# Patient Record
Sex: Female | Born: 1935
Health system: Southern US, Community
[De-identification: ages and names within clinical notes are randomized; demographics above are authoritative.]

## PROBLEM LIST (undated history)

## (undated) DIAGNOSIS — I10 Essential (primary) hypertension: Secondary | ICD-10-CM

## (undated) DIAGNOSIS — M48061 Spinal stenosis, lumbar region without neurogenic claudication: Secondary | ICD-10-CM

## (undated) DIAGNOSIS — J329 Chronic sinusitis, unspecified: Secondary | ICD-10-CM

## (undated) DIAGNOSIS — T7840XA Allergy, unspecified, initial encounter: Secondary | ICD-10-CM

## (undated) DIAGNOSIS — M8580 Other specified disorders of bone density and structure, unspecified site: Secondary | ICD-10-CM

## (undated) DIAGNOSIS — Z720 Tobacco use: Secondary | ICD-10-CM

## (undated) DIAGNOSIS — M7070 Other bursitis of hip, unspecified hip: Secondary | ICD-10-CM

## (undated) DIAGNOSIS — K819 Cholecystitis, unspecified: Secondary | ICD-10-CM

## (undated) DIAGNOSIS — B029 Zoster without complications: Secondary | ICD-10-CM

## (undated) DIAGNOSIS — K635 Polyp of colon: Secondary | ICD-10-CM

## (undated) HISTORY — DX: Tobacco use: Z72.0

## (undated) HISTORY — PX: CHOLECYSTECTOMY: SHX55

## (undated) HISTORY — DX: Polyp of colon: K63.5

## (undated) HISTORY — PX: GALLBLADDER SURGERY: SHX652

## (undated) HISTORY — DX: Allergy, unspecified, initial encounter: T78.40XA

## (undated) HISTORY — DX: Chronic sinusitis, unspecified: J32.9

## (undated) HISTORY — DX: Spinal stenosis, lumbar region without neurogenic claudication: M48.061

## (undated) HISTORY — DX: Other bursitis of hip, unspecified hip: M70.70

## (undated) HISTORY — DX: Zoster without complications: B02.9

## (undated) HISTORY — PX: CATARACT EXTRACTION: SUR2

## (undated) HISTORY — DX: Other specified disorders of bone density and structure, unspecified site: M85.80

## (undated) HISTORY — DX: Cholecystitis, unspecified: K81.9

## (undated) HISTORY — DX: Essential (primary) hypertension: I10

## (undated) HISTORY — PX: CATARACT EXTRACTION, BILATERAL: SHX1313

---

## 1999-05-15 ENCOUNTER — Ambulatory Visit (HOSPITAL_BASED_OUTPATIENT_CLINIC_OR_DEPARTMENT_OTHER): Admission: RE | Admit: 1999-05-15 | Discharge: 1999-05-15 | Payer: Self-pay | Admitting: Plastic Surgery

## 2001-09-02 ENCOUNTER — Other Ambulatory Visit: Admission: RE | Admit: 2001-09-02 | Discharge: 2001-09-02 | Payer: Self-pay | Admitting: Family Medicine

## 2001-09-15 ENCOUNTER — Encounter: Admission: RE | Admit: 2001-09-15 | Discharge: 2001-09-28 | Payer: Self-pay | Admitting: Family Medicine

## 2002-11-30 ENCOUNTER — Ambulatory Visit (HOSPITAL_COMMUNITY): Admission: RE | Admit: 2002-11-30 | Discharge: 2002-11-30 | Payer: Self-pay | Admitting: Gastroenterology

## 2005-08-11 ENCOUNTER — Other Ambulatory Visit: Admission: RE | Admit: 2005-08-11 | Discharge: 2005-08-11 | Payer: Self-pay | Admitting: Family Medicine

## 2011-11-06 DIAGNOSIS — M76899 Other specified enthesopathies of unspecified lower limb, excluding foot: Secondary | ICD-10-CM | POA: Diagnosis not present

## 2011-11-10 DIAGNOSIS — M25559 Pain in unspecified hip: Secondary | ICD-10-CM | POA: Diagnosis not present

## 2011-11-10 DIAGNOSIS — M76899 Other specified enthesopathies of unspecified lower limb, excluding foot: Secondary | ICD-10-CM | POA: Diagnosis not present

## 2011-11-12 DIAGNOSIS — M25559 Pain in unspecified hip: Secondary | ICD-10-CM | POA: Diagnosis not present

## 2011-11-12 DIAGNOSIS — M76899 Other specified enthesopathies of unspecified lower limb, excluding foot: Secondary | ICD-10-CM | POA: Diagnosis not present

## 2011-11-17 DIAGNOSIS — M25559 Pain in unspecified hip: Secondary | ICD-10-CM | POA: Diagnosis not present

## 2011-11-17 DIAGNOSIS — M76899 Other specified enthesopathies of unspecified lower limb, excluding foot: Secondary | ICD-10-CM | POA: Diagnosis not present

## 2011-11-19 DIAGNOSIS — M76899 Other specified enthesopathies of unspecified lower limb, excluding foot: Secondary | ICD-10-CM | POA: Diagnosis not present

## 2011-11-19 DIAGNOSIS — M25559 Pain in unspecified hip: Secondary | ICD-10-CM | POA: Diagnosis not present

## 2011-11-24 DIAGNOSIS — M76899 Other specified enthesopathies of unspecified lower limb, excluding foot: Secondary | ICD-10-CM | POA: Diagnosis not present

## 2011-11-24 DIAGNOSIS — M25559 Pain in unspecified hip: Secondary | ICD-10-CM | POA: Diagnosis not present

## 2011-11-26 DIAGNOSIS — M76899 Other specified enthesopathies of unspecified lower limb, excluding foot: Secondary | ICD-10-CM | POA: Diagnosis not present

## 2011-11-26 DIAGNOSIS — M25559 Pain in unspecified hip: Secondary | ICD-10-CM | POA: Diagnosis not present

## 2011-11-27 DIAGNOSIS — M76899 Other specified enthesopathies of unspecified lower limb, excluding foot: Secondary | ICD-10-CM | POA: Diagnosis not present

## 2011-12-01 DIAGNOSIS — M76899 Other specified enthesopathies of unspecified lower limb, excluding foot: Secondary | ICD-10-CM | POA: Diagnosis not present

## 2011-12-01 DIAGNOSIS — M25559 Pain in unspecified hip: Secondary | ICD-10-CM | POA: Diagnosis not present

## 2011-12-03 DIAGNOSIS — M25559 Pain in unspecified hip: Secondary | ICD-10-CM | POA: Diagnosis not present

## 2011-12-08 DIAGNOSIS — M25559 Pain in unspecified hip: Secondary | ICD-10-CM | POA: Diagnosis not present

## 2011-12-08 DIAGNOSIS — M76899 Other specified enthesopathies of unspecified lower limb, excluding foot: Secondary | ICD-10-CM | POA: Diagnosis not present

## 2011-12-10 DIAGNOSIS — M76899 Other specified enthesopathies of unspecified lower limb, excluding foot: Secondary | ICD-10-CM | POA: Diagnosis not present

## 2011-12-10 DIAGNOSIS — M25559 Pain in unspecified hip: Secondary | ICD-10-CM | POA: Diagnosis not present

## 2011-12-22 DIAGNOSIS — M25559 Pain in unspecified hip: Secondary | ICD-10-CM | POA: Diagnosis not present

## 2011-12-22 DIAGNOSIS — M76899 Other specified enthesopathies of unspecified lower limb, excluding foot: Secondary | ICD-10-CM | POA: Diagnosis not present

## 2011-12-24 DIAGNOSIS — M25559 Pain in unspecified hip: Secondary | ICD-10-CM | POA: Diagnosis not present

## 2011-12-24 DIAGNOSIS — M76899 Other specified enthesopathies of unspecified lower limb, excluding foot: Secondary | ICD-10-CM | POA: Diagnosis not present

## 2011-12-30 DIAGNOSIS — M76899 Other specified enthesopathies of unspecified lower limb, excluding foot: Secondary | ICD-10-CM | POA: Diagnosis not present

## 2011-12-30 DIAGNOSIS — M25559 Pain in unspecified hip: Secondary | ICD-10-CM | POA: Diagnosis not present

## 2012-05-26 DIAGNOSIS — Z1231 Encounter for screening mammogram for malignant neoplasm of breast: Secondary | ICD-10-CM | POA: Diagnosis not present

## 2012-09-06 DIAGNOSIS — Z23 Encounter for immunization: Secondary | ICD-10-CM | POA: Diagnosis not present

## 2013-06-20 DIAGNOSIS — Z23 Encounter for immunization: Secondary | ICD-10-CM | POA: Diagnosis not present

## 2013-08-09 DIAGNOSIS — R7301 Impaired fasting glucose: Secondary | ICD-10-CM | POA: Diagnosis not present

## 2013-08-09 DIAGNOSIS — E559 Vitamin D deficiency, unspecified: Secondary | ICD-10-CM | POA: Diagnosis not present

## 2013-08-09 DIAGNOSIS — M899 Disorder of bone, unspecified: Secondary | ICD-10-CM | POA: Diagnosis not present

## 2013-08-09 DIAGNOSIS — E7800 Pure hypercholesterolemia, unspecified: Secondary | ICD-10-CM | POA: Diagnosis not present

## 2013-08-09 DIAGNOSIS — Z23 Encounter for immunization: Secondary | ICD-10-CM | POA: Diagnosis not present

## 2013-08-09 DIAGNOSIS — Z1331 Encounter for screening for depression: Secondary | ICD-10-CM | POA: Diagnosis not present

## 2013-08-09 DIAGNOSIS — R030 Elevated blood-pressure reading, without diagnosis of hypertension: Secondary | ICD-10-CM | POA: Diagnosis not present

## 2013-08-09 DIAGNOSIS — M25539 Pain in unspecified wrist: Secondary | ICD-10-CM | POA: Diagnosis not present

## 2013-08-21 DIAGNOSIS — Z1231 Encounter for screening mammogram for malignant neoplasm of breast: Secondary | ICD-10-CM | POA: Diagnosis not present

## 2013-08-21 DIAGNOSIS — M899 Disorder of bone, unspecified: Secondary | ICD-10-CM | POA: Diagnosis not present

## 2013-10-09 DIAGNOSIS — E7800 Pure hypercholesterolemia, unspecified: Secondary | ICD-10-CM | POA: Diagnosis not present

## 2013-10-09 DIAGNOSIS — J449 Chronic obstructive pulmonary disease, unspecified: Secondary | ICD-10-CM | POA: Diagnosis not present

## 2013-10-09 DIAGNOSIS — Z0000 Encounter for general adult medical examination without abnormal findings: Secondary | ICD-10-CM | POA: Diagnosis not present

## 2013-10-09 DIAGNOSIS — E782 Mixed hyperlipidemia: Secondary | ICD-10-CM | POA: Diagnosis not present

## 2013-10-09 DIAGNOSIS — E559 Vitamin D deficiency, unspecified: Secondary | ICD-10-CM | POA: Diagnosis not present

## 2013-10-09 DIAGNOSIS — R7301 Impaired fasting glucose: Secondary | ICD-10-CM | POA: Diagnosis not present

## 2013-10-09 DIAGNOSIS — D126 Benign neoplasm of colon, unspecified: Secondary | ICD-10-CM | POA: Diagnosis not present

## 2014-02-20 DIAGNOSIS — H02839 Dermatochalasis of unspecified eye, unspecified eyelid: Secondary | ICD-10-CM | POA: Diagnosis not present

## 2014-02-20 DIAGNOSIS — H18419 Arcus senilis, unspecified eye: Secondary | ICD-10-CM | POA: Diagnosis not present

## 2014-02-20 DIAGNOSIS — H43819 Vitreous degeneration, unspecified eye: Secondary | ICD-10-CM | POA: Diagnosis not present

## 2014-02-20 DIAGNOSIS — Z961 Presence of intraocular lens: Secondary | ICD-10-CM | POA: Diagnosis not present

## 2014-03-14 DIAGNOSIS — Z23 Encounter for immunization: Secondary | ICD-10-CM | POA: Diagnosis not present

## 2014-03-14 DIAGNOSIS — Z0000 Encounter for general adult medical examination without abnormal findings: Secondary | ICD-10-CM | POA: Diagnosis not present

## 2014-03-14 DIAGNOSIS — D126 Benign neoplasm of colon, unspecified: Secondary | ICD-10-CM | POA: Diagnosis not present

## 2014-03-14 DIAGNOSIS — E559 Vitamin D deficiency, unspecified: Secondary | ICD-10-CM | POA: Diagnosis not present

## 2014-03-14 DIAGNOSIS — E782 Mixed hyperlipidemia: Secondary | ICD-10-CM | POA: Diagnosis not present

## 2014-03-14 DIAGNOSIS — E7800 Pure hypercholesterolemia, unspecified: Secondary | ICD-10-CM | POA: Diagnosis not present

## 2014-03-14 DIAGNOSIS — R7301 Impaired fasting glucose: Secondary | ICD-10-CM | POA: Diagnosis not present

## 2014-03-14 DIAGNOSIS — J449 Chronic obstructive pulmonary disease, unspecified: Secondary | ICD-10-CM | POA: Diagnosis not present

## 2014-06-13 DIAGNOSIS — Z23 Encounter for immunization: Secondary | ICD-10-CM | POA: Diagnosis not present

## 2014-11-09 DIAGNOSIS — Z961 Presence of intraocular lens: Secondary | ICD-10-CM | POA: Diagnosis not present

## 2014-11-09 DIAGNOSIS — H18411 Arcus senilis, right eye: Secondary | ICD-10-CM | POA: Diagnosis not present

## 2014-11-09 DIAGNOSIS — H18412 Arcus senilis, left eye: Secondary | ICD-10-CM | POA: Diagnosis not present

## 2014-11-27 DIAGNOSIS — Z1231 Encounter for screening mammogram for malignant neoplasm of breast: Secondary | ICD-10-CM | POA: Diagnosis not present

## 2014-11-27 DIAGNOSIS — Z803 Family history of malignant neoplasm of breast: Secondary | ICD-10-CM | POA: Diagnosis not present

## 2015-01-23 DIAGNOSIS — R0989 Other specified symptoms and signs involving the circulatory and respiratory systems: Secondary | ICD-10-CM | POA: Diagnosis not present

## 2015-01-23 DIAGNOSIS — R05 Cough: Secondary | ICD-10-CM | POA: Diagnosis not present

## 2015-01-23 DIAGNOSIS — J189 Pneumonia, unspecified organism: Secondary | ICD-10-CM | POA: Diagnosis not present

## 2015-01-24 DIAGNOSIS — J449 Chronic obstructive pulmonary disease, unspecified: Secondary | ICD-10-CM | POA: Diagnosis not present

## 2015-01-24 DIAGNOSIS — J189 Pneumonia, unspecified organism: Secondary | ICD-10-CM | POA: Diagnosis not present

## 2015-02-11 DIAGNOSIS — J189 Pneumonia, unspecified organism: Secondary | ICD-10-CM | POA: Diagnosis not present

## 2015-02-25 DIAGNOSIS — J189 Pneumonia, unspecified organism: Secondary | ICD-10-CM | POA: Diagnosis not present

## 2015-04-03 DIAGNOSIS — M8588 Other specified disorders of bone density and structure, other site: Secondary | ICD-10-CM | POA: Diagnosis not present

## 2015-04-03 DIAGNOSIS — E780 Pure hypercholesterolemia: Secondary | ICD-10-CM | POA: Diagnosis not present

## 2015-04-03 DIAGNOSIS — R7301 Impaired fasting glucose: Secondary | ICD-10-CM | POA: Diagnosis not present

## 2015-04-03 DIAGNOSIS — E559 Vitamin D deficiency, unspecified: Secondary | ICD-10-CM | POA: Diagnosis not present

## 2015-04-03 DIAGNOSIS — J449 Chronic obstructive pulmonary disease, unspecified: Secondary | ICD-10-CM | POA: Diagnosis not present

## 2015-04-05 DIAGNOSIS — E559 Vitamin D deficiency, unspecified: Secondary | ICD-10-CM | POA: Diagnosis not present

## 2015-04-05 DIAGNOSIS — R7301 Impaired fasting glucose: Secondary | ICD-10-CM | POA: Diagnosis not present

## 2015-04-05 DIAGNOSIS — M8588 Other specified disorders of bone density and structure, other site: Secondary | ICD-10-CM | POA: Diagnosis not present

## 2015-04-05 DIAGNOSIS — E780 Pure hypercholesterolemia: Secondary | ICD-10-CM | POA: Diagnosis not present

## 2015-04-05 DIAGNOSIS — M79606 Pain in leg, unspecified: Secondary | ICD-10-CM | POA: Diagnosis not present

## 2015-04-05 DIAGNOSIS — Z86010 Personal history of colonic polyps: Secondary | ICD-10-CM | POA: Diagnosis not present

## 2015-04-05 DIAGNOSIS — Z1389 Encounter for screening for other disorder: Secondary | ICD-10-CM | POA: Diagnosis not present

## 2015-04-05 DIAGNOSIS — J449 Chronic obstructive pulmonary disease, unspecified: Secondary | ICD-10-CM | POA: Diagnosis not present

## 2015-04-05 DIAGNOSIS — Z0000 Encounter for general adult medical examination without abnormal findings: Secondary | ICD-10-CM | POA: Diagnosis not present

## 2015-06-05 DIAGNOSIS — Z23 Encounter for immunization: Secondary | ICD-10-CM | POA: Diagnosis not present

## 2015-07-29 DIAGNOSIS — M25551 Pain in right hip: Secondary | ICD-10-CM | POA: Diagnosis not present

## 2015-07-29 DIAGNOSIS — M25561 Pain in right knee: Secondary | ICD-10-CM | POA: Diagnosis not present

## 2015-07-29 DIAGNOSIS — M25552 Pain in left hip: Secondary | ICD-10-CM | POA: Diagnosis not present

## 2015-07-29 DIAGNOSIS — M25562 Pain in left knee: Secondary | ICD-10-CM | POA: Diagnosis not present

## 2015-08-26 DIAGNOSIS — M25552 Pain in left hip: Secondary | ICD-10-CM | POA: Diagnosis not present

## 2015-08-26 DIAGNOSIS — M25551 Pain in right hip: Secondary | ICD-10-CM | POA: Diagnosis not present

## 2015-09-25 DIAGNOSIS — M5432 Sciatica, left side: Secondary | ICD-10-CM | POA: Diagnosis not present

## 2015-09-25 DIAGNOSIS — M5431 Sciatica, right side: Secondary | ICD-10-CM | POA: Diagnosis not present

## 2015-10-01 DIAGNOSIS — M25552 Pain in left hip: Secondary | ICD-10-CM | POA: Diagnosis not present

## 2015-10-01 DIAGNOSIS — R262 Difficulty in walking, not elsewhere classified: Secondary | ICD-10-CM | POA: Diagnosis not present

## 2015-10-01 DIAGNOSIS — M25551 Pain in right hip: Secondary | ICD-10-CM | POA: Diagnosis not present

## 2015-10-01 DIAGNOSIS — M4806 Spinal stenosis, lumbar region: Secondary | ICD-10-CM | POA: Diagnosis not present

## 2015-10-03 DIAGNOSIS — M25551 Pain in right hip: Secondary | ICD-10-CM | POA: Diagnosis not present

## 2015-10-03 DIAGNOSIS — R262 Difficulty in walking, not elsewhere classified: Secondary | ICD-10-CM | POA: Diagnosis not present

## 2015-10-03 DIAGNOSIS — M25552 Pain in left hip: Secondary | ICD-10-CM | POA: Diagnosis not present

## 2015-10-03 DIAGNOSIS — M4806 Spinal stenosis, lumbar region: Secondary | ICD-10-CM | POA: Diagnosis not present

## 2015-10-08 DIAGNOSIS — M4806 Spinal stenosis, lumbar region: Secondary | ICD-10-CM | POA: Diagnosis not present

## 2015-10-08 DIAGNOSIS — M25552 Pain in left hip: Secondary | ICD-10-CM | POA: Diagnosis not present

## 2015-10-08 DIAGNOSIS — M25551 Pain in right hip: Secondary | ICD-10-CM | POA: Diagnosis not present

## 2015-10-08 DIAGNOSIS — R262 Difficulty in walking, not elsewhere classified: Secondary | ICD-10-CM | POA: Diagnosis not present

## 2015-10-10 DIAGNOSIS — M4806 Spinal stenosis, lumbar region: Secondary | ICD-10-CM | POA: Diagnosis not present

## 2015-10-10 DIAGNOSIS — M25552 Pain in left hip: Secondary | ICD-10-CM | POA: Diagnosis not present

## 2015-10-10 DIAGNOSIS — M25551 Pain in right hip: Secondary | ICD-10-CM | POA: Diagnosis not present

## 2015-10-10 DIAGNOSIS — R262 Difficulty in walking, not elsewhere classified: Secondary | ICD-10-CM | POA: Diagnosis not present

## 2015-10-15 DIAGNOSIS — R262 Difficulty in walking, not elsewhere classified: Secondary | ICD-10-CM | POA: Diagnosis not present

## 2015-10-15 DIAGNOSIS — M25552 Pain in left hip: Secondary | ICD-10-CM | POA: Diagnosis not present

## 2015-10-15 DIAGNOSIS — M4806 Spinal stenosis, lumbar region: Secondary | ICD-10-CM | POA: Diagnosis not present

## 2015-10-15 DIAGNOSIS — M25551 Pain in right hip: Secondary | ICD-10-CM | POA: Diagnosis not present

## 2015-10-17 DIAGNOSIS — R262 Difficulty in walking, not elsewhere classified: Secondary | ICD-10-CM | POA: Diagnosis not present

## 2015-10-17 DIAGNOSIS — M4806 Spinal stenosis, lumbar region: Secondary | ICD-10-CM | POA: Diagnosis not present

## 2015-10-17 DIAGNOSIS — M25552 Pain in left hip: Secondary | ICD-10-CM | POA: Diagnosis not present

## 2015-10-17 DIAGNOSIS — M25551 Pain in right hip: Secondary | ICD-10-CM | POA: Diagnosis not present

## 2015-10-23 DIAGNOSIS — M4806 Spinal stenosis, lumbar region: Secondary | ICD-10-CM | POA: Diagnosis not present

## 2015-12-04 DIAGNOSIS — M7062 Trochanteric bursitis, left hip: Secondary | ICD-10-CM | POA: Diagnosis not present

## 2015-12-04 DIAGNOSIS — M7061 Trochanteric bursitis, right hip: Secondary | ICD-10-CM | POA: Diagnosis not present

## 2016-01-01 DIAGNOSIS — M7062 Trochanteric bursitis, left hip: Secondary | ICD-10-CM | POA: Diagnosis not present

## 2016-01-01 DIAGNOSIS — M7061 Trochanteric bursitis, right hip: Secondary | ICD-10-CM | POA: Diagnosis not present

## 2016-02-12 DIAGNOSIS — M7062 Trochanteric bursitis, left hip: Secondary | ICD-10-CM | POA: Diagnosis not present

## 2016-02-12 DIAGNOSIS — M7061 Trochanteric bursitis, right hip: Secondary | ICD-10-CM | POA: Diagnosis not present

## 2016-02-18 DIAGNOSIS — M25551 Pain in right hip: Secondary | ICD-10-CM | POA: Diagnosis not present

## 2016-02-18 DIAGNOSIS — M25552 Pain in left hip: Secondary | ICD-10-CM | POA: Diagnosis not present

## 2016-02-18 DIAGNOSIS — M7062 Trochanteric bursitis, left hip: Secondary | ICD-10-CM | POA: Diagnosis not present

## 2016-02-18 DIAGNOSIS — M7061 Trochanteric bursitis, right hip: Secondary | ICD-10-CM | POA: Diagnosis not present

## 2016-02-20 DIAGNOSIS — M25551 Pain in right hip: Secondary | ICD-10-CM | POA: Diagnosis not present

## 2016-02-20 DIAGNOSIS — M7061 Trochanteric bursitis, right hip: Secondary | ICD-10-CM | POA: Diagnosis not present

## 2016-02-20 DIAGNOSIS — M25552 Pain in left hip: Secondary | ICD-10-CM | POA: Diagnosis not present

## 2016-02-20 DIAGNOSIS — M7062 Trochanteric bursitis, left hip: Secondary | ICD-10-CM | POA: Diagnosis not present

## 2016-03-10 DIAGNOSIS — M25551 Pain in right hip: Secondary | ICD-10-CM | POA: Diagnosis not present

## 2016-03-10 DIAGNOSIS — M25552 Pain in left hip: Secondary | ICD-10-CM | POA: Diagnosis not present

## 2016-03-10 DIAGNOSIS — M7062 Trochanteric bursitis, left hip: Secondary | ICD-10-CM | POA: Diagnosis not present

## 2016-03-10 DIAGNOSIS — M7061 Trochanteric bursitis, right hip: Secondary | ICD-10-CM | POA: Diagnosis not present

## 2016-03-12 DIAGNOSIS — M25551 Pain in right hip: Secondary | ICD-10-CM | POA: Diagnosis not present

## 2016-03-12 DIAGNOSIS — M7062 Trochanteric bursitis, left hip: Secondary | ICD-10-CM | POA: Diagnosis not present

## 2016-03-12 DIAGNOSIS — M25552 Pain in left hip: Secondary | ICD-10-CM | POA: Diagnosis not present

## 2016-03-12 DIAGNOSIS — M7061 Trochanteric bursitis, right hip: Secondary | ICD-10-CM | POA: Diagnosis not present

## 2016-03-17 DIAGNOSIS — M7062 Trochanteric bursitis, left hip: Secondary | ICD-10-CM | POA: Diagnosis not present

## 2016-03-17 DIAGNOSIS — M25551 Pain in right hip: Secondary | ICD-10-CM | POA: Diagnosis not present

## 2016-03-17 DIAGNOSIS — M25552 Pain in left hip: Secondary | ICD-10-CM | POA: Diagnosis not present

## 2016-03-17 DIAGNOSIS — M7061 Trochanteric bursitis, right hip: Secondary | ICD-10-CM | POA: Diagnosis not present

## 2016-03-23 DIAGNOSIS — M25551 Pain in right hip: Secondary | ICD-10-CM | POA: Diagnosis not present

## 2016-03-23 DIAGNOSIS — M25552 Pain in left hip: Secondary | ICD-10-CM | POA: Diagnosis not present

## 2016-03-23 DIAGNOSIS — M7062 Trochanteric bursitis, left hip: Secondary | ICD-10-CM | POA: Diagnosis not present

## 2016-03-23 DIAGNOSIS — M7061 Trochanteric bursitis, right hip: Secondary | ICD-10-CM | POA: Diagnosis not present

## 2016-03-25 DIAGNOSIS — M25551 Pain in right hip: Secondary | ICD-10-CM | POA: Diagnosis not present

## 2016-03-25 DIAGNOSIS — M7061 Trochanteric bursitis, right hip: Secondary | ICD-10-CM | POA: Diagnosis not present

## 2016-03-25 DIAGNOSIS — M25552 Pain in left hip: Secondary | ICD-10-CM | POA: Diagnosis not present

## 2016-03-25 DIAGNOSIS — M7062 Trochanteric bursitis, left hip: Secondary | ICD-10-CM | POA: Diagnosis not present

## 2016-04-01 DIAGNOSIS — M4806 Spinal stenosis, lumbar region: Secondary | ICD-10-CM | POA: Diagnosis not present

## 2016-04-22 DIAGNOSIS — M4806 Spinal stenosis, lumbar region: Secondary | ICD-10-CM | POA: Diagnosis not present

## 2016-06-22 DIAGNOSIS — Z23 Encounter for immunization: Secondary | ICD-10-CM | POA: Diagnosis not present

## 2016-07-21 DIAGNOSIS — R7301 Impaired fasting glucose: Secondary | ICD-10-CM | POA: Diagnosis not present

## 2016-07-21 DIAGNOSIS — E559 Vitamin D deficiency, unspecified: Secondary | ICD-10-CM | POA: Diagnosis not present

## 2016-07-21 DIAGNOSIS — E7800 Pure hypercholesterolemia, unspecified: Secondary | ICD-10-CM | POA: Diagnosis not present

## 2016-07-24 DIAGNOSIS — M8588 Other specified disorders of bone density and structure, other site: Secondary | ICD-10-CM | POA: Diagnosis not present

## 2016-07-24 DIAGNOSIS — R7301 Impaired fasting glucose: Secondary | ICD-10-CM | POA: Diagnosis not present

## 2016-07-24 DIAGNOSIS — J449 Chronic obstructive pulmonary disease, unspecified: Secondary | ICD-10-CM | POA: Diagnosis not present

## 2016-07-24 DIAGNOSIS — E7800 Pure hypercholesterolemia, unspecified: Secondary | ICD-10-CM | POA: Diagnosis not present

## 2016-07-24 DIAGNOSIS — M79604 Pain in right leg: Secondary | ICD-10-CM | POA: Diagnosis not present

## 2016-07-24 DIAGNOSIS — E559 Vitamin D deficiency, unspecified: Secondary | ICD-10-CM | POA: Diagnosis not present

## 2016-07-24 DIAGNOSIS — E878 Other disorders of electrolyte and fluid balance, not elsewhere classified: Secondary | ICD-10-CM | POA: Diagnosis not present

## 2016-08-04 DIAGNOSIS — M48062 Spinal stenosis, lumbar region with neurogenic claudication: Secondary | ICD-10-CM | POA: Diagnosis not present

## 2016-08-17 DIAGNOSIS — M48062 Spinal stenosis, lumbar region with neurogenic claudication: Secondary | ICD-10-CM | POA: Diagnosis not present

## 2016-08-25 DIAGNOSIS — M48062 Spinal stenosis, lumbar region with neurogenic claudication: Secondary | ICD-10-CM | POA: Diagnosis not present

## 2016-09-17 DIAGNOSIS — Z1231 Encounter for screening mammogram for malignant neoplasm of breast: Secondary | ICD-10-CM | POA: Diagnosis not present

## 2016-09-17 DIAGNOSIS — Z803 Family history of malignant neoplasm of breast: Secondary | ICD-10-CM | POA: Diagnosis not present

## 2016-09-17 DIAGNOSIS — M8580 Other specified disorders of bone density and structure, unspecified site: Secondary | ICD-10-CM | POA: Diagnosis not present

## 2016-09-25 DIAGNOSIS — E559 Vitamin D deficiency, unspecified: Secondary | ICD-10-CM | POA: Diagnosis not present

## 2016-09-25 DIAGNOSIS — Z1389 Encounter for screening for other disorder: Secondary | ICD-10-CM | POA: Diagnosis not present

## 2016-09-25 DIAGNOSIS — Z0000 Encounter for general adult medical examination without abnormal findings: Secondary | ICD-10-CM | POA: Diagnosis not present

## 2016-09-25 DIAGNOSIS — J449 Chronic obstructive pulmonary disease, unspecified: Secondary | ICD-10-CM | POA: Diagnosis not present

## 2016-09-25 DIAGNOSIS — R7301 Impaired fasting glucose: Secondary | ICD-10-CM | POA: Diagnosis not present

## 2016-09-25 DIAGNOSIS — M8588 Other specified disorders of bone density and structure, other site: Secondary | ICD-10-CM | POA: Diagnosis not present

## 2016-09-25 DIAGNOSIS — E7800 Pure hypercholesterolemia, unspecified: Secondary | ICD-10-CM | POA: Diagnosis not present

## 2016-09-25 DIAGNOSIS — Z86010 Personal history of colonic polyps: Secondary | ICD-10-CM | POA: Diagnosis not present

## 2016-09-29 DIAGNOSIS — M47816 Spondylosis without myelopathy or radiculopathy, lumbar region: Secondary | ICD-10-CM | POA: Diagnosis not present

## 2016-10-12 DIAGNOSIS — M48062 Spinal stenosis, lumbar region with neurogenic claudication: Secondary | ICD-10-CM | POA: Diagnosis not present

## 2016-10-20 DIAGNOSIS — M48062 Spinal stenosis, lumbar region with neurogenic claudication: Secondary | ICD-10-CM | POA: Diagnosis not present

## 2016-10-27 DIAGNOSIS — M48062 Spinal stenosis, lumbar region with neurogenic claudication: Secondary | ICD-10-CM | POA: Diagnosis not present

## 2016-11-03 DIAGNOSIS — M47816 Spondylosis without myelopathy or radiculopathy, lumbar region: Secondary | ICD-10-CM | POA: Diagnosis not present

## 2016-11-03 DIAGNOSIS — M48062 Spinal stenosis, lumbar region with neurogenic claudication: Secondary | ICD-10-CM | POA: Diagnosis not present

## 2016-11-18 DIAGNOSIS — M47816 Spondylosis without myelopathy or radiculopathy, lumbar region: Secondary | ICD-10-CM | POA: Diagnosis not present

## 2016-12-03 DIAGNOSIS — M48062 Spinal stenosis, lumbar region with neurogenic claudication: Secondary | ICD-10-CM | POA: Diagnosis not present

## 2016-12-03 DIAGNOSIS — M47816 Spondylosis without myelopathy or radiculopathy, lumbar region: Secondary | ICD-10-CM | POA: Diagnosis not present

## 2016-12-16 DIAGNOSIS — M5136 Other intervertebral disc degeneration, lumbar region: Secondary | ICD-10-CM | POA: Diagnosis not present

## 2016-12-16 DIAGNOSIS — M47816 Spondylosis without myelopathy or radiculopathy, lumbar region: Secondary | ICD-10-CM | POA: Diagnosis not present

## 2017-03-29 DIAGNOSIS — R7301 Impaired fasting glucose: Secondary | ICD-10-CM | POA: Diagnosis not present

## 2017-03-29 DIAGNOSIS — E559 Vitamin D deficiency, unspecified: Secondary | ICD-10-CM | POA: Diagnosis not present

## 2017-03-29 DIAGNOSIS — N183 Chronic kidney disease, stage 3 (moderate): Secondary | ICD-10-CM | POA: Diagnosis not present

## 2017-03-29 DIAGNOSIS — E7800 Pure hypercholesterolemia, unspecified: Secondary | ICD-10-CM | POA: Diagnosis not present

## 2017-06-30 DIAGNOSIS — M48062 Spinal stenosis, lumbar region with neurogenic claudication: Secondary | ICD-10-CM | POA: Diagnosis not present

## 2017-06-30 DIAGNOSIS — M5136 Other intervertebral disc degeneration, lumbar region: Secondary | ICD-10-CM | POA: Diagnosis not present

## 2017-07-06 DIAGNOSIS — Z23 Encounter for immunization: Secondary | ICD-10-CM | POA: Diagnosis not present

## 2017-07-12 DIAGNOSIS — M4316 Spondylolisthesis, lumbar region: Secondary | ICD-10-CM | POA: Diagnosis not present

## 2017-07-16 DIAGNOSIS — Z961 Presence of intraocular lens: Secondary | ICD-10-CM | POA: Diagnosis not present

## 2017-07-16 DIAGNOSIS — H18413 Arcus senilis, bilateral: Secondary | ICD-10-CM | POA: Diagnosis not present

## 2017-07-16 DIAGNOSIS — H02839 Dermatochalasis of unspecified eye, unspecified eyelid: Secondary | ICD-10-CM | POA: Diagnosis not present

## 2017-07-26 DIAGNOSIS — M4316 Spondylolisthesis, lumbar region: Secondary | ICD-10-CM | POA: Diagnosis not present

## 2017-08-19 DIAGNOSIS — M5136 Other intervertebral disc degeneration, lumbar region: Secondary | ICD-10-CM | POA: Diagnosis not present

## 2017-10-20 DIAGNOSIS — M5136 Other intervertebral disc degeneration, lumbar region: Secondary | ICD-10-CM | POA: Diagnosis not present

## 2017-10-20 DIAGNOSIS — M545 Low back pain: Secondary | ICD-10-CM | POA: Diagnosis not present

## 2017-10-20 DIAGNOSIS — Z79891 Long term (current) use of opiate analgesic: Secondary | ICD-10-CM | POA: Diagnosis not present

## 2017-12-15 DIAGNOSIS — M5136 Other intervertebral disc degeneration, lumbar region: Secondary | ICD-10-CM | POA: Diagnosis not present

## 2017-12-15 DIAGNOSIS — Z79891 Long term (current) use of opiate analgesic: Secondary | ICD-10-CM | POA: Diagnosis not present

## 2017-12-15 DIAGNOSIS — M545 Low back pain: Secondary | ICD-10-CM | POA: Diagnosis not present

## 2018-03-16 DIAGNOSIS — M5136 Other intervertebral disc degeneration, lumbar region: Secondary | ICD-10-CM | POA: Diagnosis not present

## 2018-03-16 DIAGNOSIS — Z79891 Long term (current) use of opiate analgesic: Secondary | ICD-10-CM | POA: Diagnosis not present

## 2018-04-13 DIAGNOSIS — Z79891 Long term (current) use of opiate analgesic: Secondary | ICD-10-CM | POA: Diagnosis not present

## 2018-04-13 DIAGNOSIS — M545 Low back pain: Secondary | ICD-10-CM | POA: Diagnosis not present

## 2018-04-13 DIAGNOSIS — M5136 Other intervertebral disc degeneration, lumbar region: Secondary | ICD-10-CM | POA: Diagnosis not present

## 2018-04-13 DIAGNOSIS — M7061 Trochanteric bursitis, right hip: Secondary | ICD-10-CM | POA: Diagnosis not present

## 2018-05-25 DIAGNOSIS — M5136 Other intervertebral disc degeneration, lumbar region: Secondary | ICD-10-CM | POA: Diagnosis not present

## 2018-05-25 DIAGNOSIS — M545 Low back pain: Secondary | ICD-10-CM | POA: Diagnosis not present

## 2018-05-25 DIAGNOSIS — Z79891 Long term (current) use of opiate analgesic: Secondary | ICD-10-CM | POA: Diagnosis not present

## 2018-06-20 DIAGNOSIS — Z23 Encounter for immunization: Secondary | ICD-10-CM | POA: Diagnosis not present

## 2018-07-12 ENCOUNTER — Other Ambulatory Visit: Payer: Self-pay

## 2018-07-12 ENCOUNTER — Ambulatory Visit: Payer: Medicare Other | Attending: Physical Medicine and Rehabilitation | Admitting: Physical Therapy

## 2018-07-12 ENCOUNTER — Encounter: Payer: Self-pay | Admitting: Physical Therapy

## 2018-07-12 DIAGNOSIS — G8929 Other chronic pain: Secondary | ICD-10-CM | POA: Diagnosis not present

## 2018-07-12 DIAGNOSIS — M545 Low back pain, unspecified: Secondary | ICD-10-CM

## 2018-07-12 DIAGNOSIS — M25551 Pain in right hip: Secondary | ICD-10-CM | POA: Insufficient documentation

## 2018-07-12 DIAGNOSIS — M25552 Pain in left hip: Secondary | ICD-10-CM

## 2018-07-12 DIAGNOSIS — M6281 Muscle weakness (generalized): Secondary | ICD-10-CM | POA: Insufficient documentation

## 2018-07-12 NOTE — Therapy (Signed)
[facility] [address], [place], [place], [address] Phone: [phone]   Fax:  [phone]  Physical Therapy Evaluation  Patient Details  Name: Alison Lewis MRN: [id number] Date of Birth: [birth year] Referring Provider (PT): Suella Broad, MD   Encounter Date: 07/12/2018  PT End of Session - 07/12/18 1312    Visit Number  1    Date for PT Re-Evaluation  09/12/18    Authorization Type  Medicare     Authorization Time Period  07/12/18 to 09/12/18    Authorization - Visit Number  1    Authorization - Number of Visits  10    PT Start Time  9485    PT Stop Time  1312    PT Time Calculation (min)  42 min    Activity Tolerance  No increased pain;Patient tolerated treatment well    Behavior During Therapy  [facility] for tasks assessed/performed       History reviewed. No pertinent past medical history.  History reviewed. No pertinent surgical history.  There were no vitals filed for this visit.   Subjective Assessment - 07/12/18 1234    Subjective  Pt reports that she has had bursitis in both hips which has improved some over the years with therapy. She has had back injections without any improvement. Mostly, she is limited with her walking and standing. She denies any pain when sleeping. She does note some numbness/tingling intermittently but isn't sure what changes it.     Limitations  House hold activities;Standing;Walking    How long can you stand comfortably?  10 minutes     How long can you walk comfortably?  unable to go shopping    Patient Stated Goals  improve walking     Currently in Pain?  No/denies    Pain Location  --   B lateral thigh down to the outside knee         [facility] PT Assessment - 07/12/18 0001      Assessment   Medical Diagnosis  intervertebral disc degeneration    Referring Provider (PT)  Suella Broad, MD    Onset Date/Surgical Date  --   10+ years ago   Next MD Visit  sometime in  November     Prior Therapy  some with improvement       Precautions   Precautions  None      Balance Screen   Has the patient fallen in the past 6 months  No    Has the patient had a decrease in activity level because of a fear of falling?   No    Is the patient reluctant to leave their home because of a fear of falling?   No      Home Film/video editor residence    Living Arrangements  Spouse/significant other    Additional Comments  1 flight of stairs, does have pain when going up      Prior Function   Level of Independence  Independent      Cognition   Overall Cognitive Status  Within Functional Limits for tasks assessed      ROM / Strength   AROM / PROM / Strength  AROM;Strength      AROM   AROM Assessment Site  Lumbar    Lumbar Flexion  limited, pulling stretch    Lumbar - Right Rotation  limited painful    Lumbar - Left Rotation  limited painful  Strength   Strength Assessment Site  Hip;Knee;Ankle    Right/Left Hip  Right;Left    Right Hip Flexion  3/5    Right Hip Extension  3/5    Right Hip ABduction  3/5    Left Hip Flexion  3/5    Left Hip Extension  3/5    Left Hip ABduction  3/5    Right/Left Knee  --   knee extension 5/5 MMT     Flexibility   Soft Tissue Assessment /Muscle Length  yes    Hamstrings  45 deg lacking BLE      Palpation   Palpation comment  tenderness along lateral quadriceps, ITB, glute med      Special Tests   Other special tests  slump negative Lt and Rt (+) neural tension noted      Transfers   Five time sit to stand comments   16 sec                Objective measurements completed on examination: See above findings.      [facility] Adult PT Treatment/Exercise - 07/12/18 0001      Exercises   Exercises  Knee/Hip      Knee/Hip Exercises: Stretches   Piriformis Stretch  1 rep;Both;30 seconds    Piriformis Stretch Limitations  supine       Knee/Hip Exercises: Supine   Other Supine  Knee/Hip Exercises  clamshell with red TB x10 reps each LE             PT Education - 07/12/18 1358    Education Details  eval findings/POC; implemented and reviewed HEP    Person(s) Educated  Patient    Methods  Verbal cues;Explanation    Comprehension  Verbalized understanding       PT Short Term Goals - 07/12/18 1350      PT SHORT TERM GOAL #1   Title  Pt will demo consistency and independence with her initial HEP to decrease pain and improve LE strength.    Time  3    Period  Weeks    Status  New    Target Date  08/02/18        PT Long Term Goals - 07/12/18 1350      PT LONG TERM GOAL #1   Title  Pt will have increased hamstring flexibility to lacking no more than 30 deg with 90/90 testing, which will improve her standing posture throughout the day.    Time  8    Period  Weeks    Status  New    Target Date  09/12/18      PT LONG TERM GOAL #2   Title  Pt will demo improved BLE strength and power evident by her ability to complete 5x sit to stand in less than 14 sec without UE support or significant knee valgus deviation.     Time  8    Period  Weeks    Status  New      PT LONG TERM GOAL #3   Title  Pt will have atleast 4/5 MMT strenght of the LEs which will improve her efficiency with daily tasks.    Time  8    Period  Weeks    Status  New      PT LONG TERM GOAL #4   Title  Pt will report being able to stand and complete light household activity for up to 15 minutes without the need for a rest  break.     Time  8    Period  Weeks    Status  New      PT LONG TERM GOAL #5   Title  Pt will report being able to ascend and descend her steps at home, x1 trial, without increase in BLE pain.     Time  8    Period  Weeks    Status  New             Plan - 07/12/18 1314    Clinical Impression Statement  Pt is a pleasant 82 y.o F referred to OPPT with complaints of B LE pain with daily activity. Her pain is worse with standing, walking and going up stairs  and is made better with rest only. She does demonstrate limited lumbar mobility, limited LE flexibility and has significant weakness of the hips. She completed 5x sit to stand in 16 sec with noted knee valgus compensation due to poor LE endurance, power and strength. She would like to be able to walk into a grocery store or around her home without as much limitation and would benefit from skilled PT to address her limitations listed above and to facilitate independence with her daily routine at home and in the community.     Clinical Presentation  Evolving    Clinical Presentation due to:  worsening over time     Clinical Decision Making  Moderate    Rehab Potential  Good    PT Frequency  2x / week    PT Duration  8 weeks    PT Treatment/Interventions  ADLs/Self Care Home Management;Moist Heat;Iontophoresis 4mg /ml Dexamethasone;Electrical Stimulation;Cryotherapy;Functional mobility training;Therapeutic activities;Therapeutic exercise;Patient/family education;Balance training;Neuromuscular re-education;Manual techniques;Passive range of motion;Dry needling    PT Next Visit Plan  provide HEP: hip flexibility and hip ext/abd strength; thoracic rotation/lumbar rotation stretch; progress hip strength     PT Home Exercise Plan  unable to print due to medbridge error: supine piriformis stretch, supine clam with red TB    Consulted and Agree with Plan of Care  Patient       Patient will benefit from skilled therapeutic intervention in order to improve the following deficits and impairments:  Decreased activity tolerance, Decreased strength, Impaired flexibility, Postural dysfunction, Pain, Improper body mechanics, Decreased range of motion, Decreased balance, Difficulty walking, Increased muscle spasms, Hypomobility  Visit Diagnosis: Pain in right hip  Pain in left hip  Chronic low back pain, unspecified back pain laterality, unspecified whether sciatica present  Muscle weakness  (generalized)     Problem List There are no active problems to display for this patient.   1:59 PM,07/12/18 Sherol Dade PT, DPT [place] [facility] [place]   [facility] [address] [place], [place], [address] Phone: [phone]   Fax:  [phone]  Name: Alison Lewis MRN: [id number] Date of Birth: 08-27-1936

## 2018-07-18 ENCOUNTER — Ambulatory Visit: Payer: Medicare Other | Admitting: Physical Therapy

## 2018-07-18 ENCOUNTER — Encounter: Payer: Self-pay | Admitting: Physical Therapy

## 2018-07-18 DIAGNOSIS — M25551 Pain in right hip: Secondary | ICD-10-CM

## 2018-07-18 DIAGNOSIS — M545 Low back pain, unspecified: Secondary | ICD-10-CM

## 2018-07-18 DIAGNOSIS — M6281 Muscle weakness (generalized): Secondary | ICD-10-CM | POA: Diagnosis not present

## 2018-07-18 DIAGNOSIS — M25552 Pain in left hip: Secondary | ICD-10-CM

## 2018-07-18 DIAGNOSIS — G8929 Other chronic pain: Secondary | ICD-10-CM

## 2018-07-18 NOTE — Therapy (Signed)
[facility] [address], [place], [place], [address] Phone: [phone]   Fax:  [phone]  Physical Therapy Treatment  Patient Details  Name: Alison Lewis MRN: [id number] Date of Birth: 15-Aug-1936 Referring Provider (PT): Suella Broad, MD   Encounter Date: 07/18/2018  PT End of Session - 07/18/18 1407    Visit Number  2    Date for PT Re-Evaluation  09/12/18    Authorization Type  Medicare     Authorization Time Period  07/12/18 to 09/12/18    Authorization - Visit Number  2    Authorization - Number of Visits  10    PT Start Time  9509    PT Stop Time  1445    PT Time Calculation (min)  40 min    Activity Tolerance  Patient tolerated treatment well    Behavior During Therapy  [facility] for tasks assessed/performed       History reviewed. No pertinent past medical history.  History reviewed. No pertinent surgical history.  There were no vitals filed for this visit.  Subjective Assessment - 07/18/18 1408    Subjective  Not doing HEP faithfully.     Currently in Pain?  No/denies    Multiple Pain Sites  No                       [facility] Adult PT Treatment/Exercise - 07/18/18 0001      Lumbar Exercises: Stretches   Single Knee to Chest Stretch  Right;Left;2 reps;30 seconds    Lower Trunk Rotation  3 reps;20 seconds    Figure 4 Stretch  2 reps;30 seconds;With overpressure    Other Lumbar Stretch Exercise  sidelying open book 10x bil       Knee/Hip Exercises: Aerobic   Nustep  L1 x 5 min post session   PTA present to monitor pt     Knee/Hip Exercises: Standing   Hip Abduction  AROM;Stengthening;Both;1 set;10 reps;Knee straight   initial instructional cuing     Knee/Hip Exercises: Supine   Bridges  --   isometric add/glute squeeze 3 sec 5x2              PT Short Term Goals - 07/12/18 1350      PT SHORT TERM GOAL #1   Title  Pt will demo consistency and independence with her  initial HEP to decrease pain and improve LE strength.    Time  3    Period  Weeks    Status  New    Target Date  08/02/18        PT Long Term Goals - 07/12/18 1350      PT LONG TERM GOAL #1   Title  Pt will have increased hamstring flexibility to lacking no more than 30 deg with 90/90 testing, which will improve her standing posture throughout the day.    Time  8    Period  Weeks    Status  New    Target Date  09/12/18      PT LONG TERM GOAL #2   Title  Pt will demo improved BLE strength and power evident by her ability to complete 5x sit to stand in less than 14 sec without UE support or significant knee valgus deviation.     Time  8    Period  Weeks    Status  New      PT LONG TERM GOAL #3  Title  Pt will have atleast 4/5 MMT strenght of the LEs which will improve her efficiency with daily tasks.    Time  8    Period  Weeks    Status  New      PT LONG TERM GOAL #4   Title  Pt will report being able to stand and complete light household activity for up to 15 minutes without the need for a rest break.     Time  8    Period  Weeks    Status  New      PT LONG TERM GOAL #5   Title  Pt will report being able to ascend and descend her steps at home, x1 trial, without increase in BLE pain.     Time  8    Period  Weeks    Status  New            Plan - 07/18/18 1410    Clinical Impression Statement  Pt reports semi-compliance with her initial HEP. PTA encouraged her to think about her exercises as medicine and to take her "medicine" everyday. She agreed to try and do better. HEP was progressed to add more LE flexibility and gentle begins of standing hip abduction strength. LE were shakey and fatigued at the end of her session per pt report today but no pain.     Rehab Potential  Good    PT Frequency  2x / week    PT Duration  8 weeks    PT Treatment/Interventions  ADLs/Self Care Home Management;Moist Heat;Iontophoresis 4mg /ml Dexamethasone;Electrical  Stimulation;Cryotherapy;Functional mobility training;Therapeutic activities;Therapeutic exercise;Patient/family education;Balance training;Neuromuscular re-education;Manual techniques;Passive range of motion;Dry needling    PT Next Visit Plan  Review HEP given today, Nustep, sit to stand, try 1# with hip abduction    Recommended Other Services  [email or web address] Access code    Consulted and Agree with Plan of Care  Patient       Patient will benefit from skilled therapeutic intervention in order to improve the following deficits and impairments:  Decreased activity tolerance, Decreased strength, Impaired flexibility, Postural dysfunction, Pain, Improper body mechanics, Decreased range of motion, Decreased balance, Difficulty walking, Increased muscle spasms, Hypomobility  Visit Diagnosis: Pain in right hip  Pain in left hip  Chronic low back pain, unspecified back pain laterality, unspecified whether sciatica present  Muscle weakness (generalized)     Problem List There are no active problems to display for this patient.   COCHRAN,JENNIFER , PTA 07/18/2018, 3:31 PM  [facility] [address] [place], [place], [address] Phone: [phone]   Fax:  [phone]  Name: Alison Lewis MRN: [id number] Date of Birth: December 09, 1935  Access Code: [email or web address]  URL: [email or web address]  Date: 07/18/2018  Prepared by: Myrene Galas   Exercises  Supine Figure 4 Piriformis Stretch - 2 reps - 1 sets - 20 hold - 2x daily - 7x weekly  Hooklying Single Knee to Chest Stretch - 3 reps - 1 sets - 20 hold - 2x daily - 7x weekly  Supine Lower Trunk Rotation - 3 reps - 1 sets - 20 hold - 2x daily - 7x weekly  Sidelying Thoracic Lumbar Rotation - 10 reps - 1 sets - 1x daily - 7x weekly  Standing Hip Abduction with Counter Support - 10 reps - 1 sets - 2x daily - 7x weekly

## 2018-07-21 ENCOUNTER — Encounter: Payer: Self-pay | Admitting: Physical Therapy

## 2018-07-21 ENCOUNTER — Ambulatory Visit: Payer: Medicare Other | Admitting: Physical Therapy

## 2018-07-21 DIAGNOSIS — G8929 Other chronic pain: Secondary | ICD-10-CM | POA: Diagnosis not present

## 2018-07-21 DIAGNOSIS — M25552 Pain in left hip: Secondary | ICD-10-CM

## 2018-07-21 DIAGNOSIS — M6281 Muscle weakness (generalized): Secondary | ICD-10-CM

## 2018-07-21 DIAGNOSIS — M545 Low back pain, unspecified: Secondary | ICD-10-CM

## 2018-07-21 DIAGNOSIS — M25551 Pain in right hip: Secondary | ICD-10-CM

## 2018-07-21 NOTE — Therapy (Signed)
[facility] [address], [place], [place], [address] Phone: [phone]   Fax:  [phone]  Physical Therapy Treatment  Patient Details  Name: TYMBER STALLINGS MRN: [id number] Date of Birth: 05-Nov-1935 Referring Provider (PT): Suella Broad, MD   Encounter Date: 07/21/2018  PT End of Session - 07/21/18 1240    Visit Number  3    Date for PT Re-Evaluation  09/12/18    Authorization Type  Medicare     Authorization Time Period  07/12/18 to 09/12/18    Authorization - Visit Number  3    Authorization - Number of Visits  10    PT Start Time  2119    PT Stop Time  1228    PT Time Calculation (min)  42 min    Activity Tolerance  Patient tolerated treatment well;No increased pain    Behavior During Therapy  [facility] for tasks assessed/performed       History reviewed. No pertinent past medical history.  History reviewed. No pertinent surgical history.  There were no vitals filed for this visit.  Subjective Assessment - 07/21/18 1150    Subjective  Pt states that she is doing well. She was sore for about 1 day following her last session. No pain currently, but her legs are tired from the Nustep.     Currently in Pain?  No/denies                       [facility] Adult PT Treatment/Exercise - 07/21/18 0001      Exercises   Exercises  Knee/Hip      Knee/Hip Exercises: Stretches   Piriformis Stretch  2 reps;Both;20 seconds    Piriformis Stretch Limitations  seated       Knee/Hip Exercises: Supine   Other Supine Knee/Hip Exercises  B knees to chest with LE on red physioball x15 reps    Other Supine Knee/Hip Exercises  single leg hip extension isometric hold 10x3 sec each; clamshell x15 reps each LE with red TB       Knee/Hip Exercises: Sidelying   Other Sidelying Knee/Hip Exercises  thoracic rotation x15 reps each       Manual Therapy   Manual Therapy  Soft tissue mobilization    Soft tissue  mobilization  STM Rt gluteals/lateral quadriceps        Trigger Point Dry Needling - 07/21/18 1245    Consent Given?  Yes    Education Handout Provided  Yes    Muscles Treated Lower Body  Gluteus minimus;Gluteus maximus   Rt   Gluteus Maximus Response  Twitch response elicited;Palpable increased muscle length    Gluteus Minimus Response  Twitch response elicited;Palpable increased muscle length           PT Education - 07/21/18 1239    Education Details  importance of increasing HEP adherence; dry needling info     Person(s) Educated  Patient    Methods  Explanation;Verbal cues;Handout    Comprehension  Verbalized understanding;Returned demonstration       PT Short Term Goals - 07/12/18 1350      PT SHORT TERM GOAL #1   Title  Pt will demo consistency and independence with her initial HEP to decrease pain and improve LE strength.    Time  3    Period  Weeks    Status  New    Target Date  08/02/18  PT Long Term Goals - 07/12/18 1350      PT LONG TERM GOAL #1   Title  Pt will have increased hamstring flexibility to lacking no more than 30 deg with 90/90 testing, which will improve her standing posture throughout the day.    Time  8    Period  Weeks    Status  New    Target Date  09/12/18      PT LONG TERM GOAL #2   Title  Pt will demo improved BLE strength and power evident by her ability to complete 5x sit to stand in less than 14 sec without UE support or significant knee valgus deviation.     Time  8    Period  Weeks    Status  New      PT LONG TERM GOAL #3   Title  Pt will have atleast 4/5 MMT strenght of the LEs which will improve her efficiency with daily tasks.    Time  8    Period  Weeks    Status  New      PT LONG TERM GOAL #4   Title  Pt will report being able to stand and complete light household activity for up to 15 minutes without the need for a rest break.     Time  8    Period  Weeks    Status  New      PT LONG TERM GOAL #5   Title   Pt will report being able to ascend and descend her steps at home, x1 trial, without increase in BLE pain.     Time  8    Period  Weeks    Status  New            Plan - 07/21/18 1242    Clinical Impression Statement  Pt arrived with some soreness in B hips with walking and during the Nustep. Session focused on therex to promote hip strength and lumbar/hip flexibility. Pt was able to complete resistance therex without much difficulty and will likely be able to progress this in future sessions. Pt was agreeable to dry needling treatment with multiple twitch responses noted in the Rt gluteals. Ended session with reported resolution of pain with ambulation on the Rt LE. Will continue to encourage HEP adherence and progress hip strength/flexibility moving forward.     Rehab Potential  Good    PT Frequency  2x / week    PT Duration  8 weeks    PT Treatment/Interventions  ADLs/Self Care Home Management;Moist Heat;Iontophoresis 4mg /ml Dexamethasone;Electrical Stimulation;Cryotherapy;Functional mobility training;Therapeutic activities;Therapeutic exercise;Patient/family education;Balance training;Neuromuscular re-education;Manual techniques;Passive range of motion;Dry needling    PT Next Visit Plan  f/u on HEP adherence; f/u on d/n response; progress hip rotation, extension and abduction strength     Recommended Other Services  MD signed cert     Consulted and Agree with Plan of Care  Patient       Patient will benefit from skilled therapeutic intervention in order to improve the following deficits and impairments:  Decreased activity tolerance, Decreased strength, Impaired flexibility, Postural dysfunction, Pain, Improper body mechanics, Decreased range of motion, Decreased balance, Difficulty walking, Increased muscle spasms, Hypomobility  Visit Diagnosis: Pain in right hip  Pain in left hip  Chronic low back pain, unspecified back pain laterality, unspecified whether sciatica  present  Muscle weakness (generalized)     Problem List There are no active problems to display for this patient.   Clarise Cruz  Aoi Kouns 07/21/2018, 1:15 PM  [facility] [address], [place], [place], [address] Phone: [phone]   Fax:  [phone]  Name: KALISTA LAGUARDIA MRN: [id number] Date of Birth: 1935-12-23

## 2018-07-21 NOTE — Patient Instructions (Signed)

## 2018-07-22 ENCOUNTER — Encounter

## 2018-07-27 ENCOUNTER — Encounter: Payer: Self-pay | Admitting: Physical Therapy

## 2018-07-27 ENCOUNTER — Ambulatory Visit: Payer: Medicare Other | Attending: Physical Medicine and Rehabilitation | Admitting: Physical Therapy

## 2018-07-27 DIAGNOSIS — M25552 Pain in left hip: Secondary | ICD-10-CM | POA: Diagnosis not present

## 2018-07-27 DIAGNOSIS — G8929 Other chronic pain: Secondary | ICD-10-CM | POA: Diagnosis not present

## 2018-07-27 DIAGNOSIS — M545 Low back pain, unspecified: Secondary | ICD-10-CM

## 2018-07-27 DIAGNOSIS — M6281 Muscle weakness (generalized): Secondary | ICD-10-CM | POA: Diagnosis not present

## 2018-07-27 DIAGNOSIS — M25551 Pain in right hip: Secondary | ICD-10-CM | POA: Insufficient documentation

## 2018-07-27 NOTE — Therapy (Signed)
[facility] [address] [place], [place], [address] Phone: [phone]   Fax:  [phone]  Physical Therapy Treatment  Patient Details  Name: Alison Lewis MRN: [id number] Date of Birth: [birth year] Referring Provider (PT): Suella Broad, MD   Encounter Date: 07/27/2018  PT End of Session - 07/27/18 1230    Visit Number  4    Date for PT Re-Evaluation  09/12/18    Authorization Type  Medicare     Authorization Time Period  07/12/18 to 09/12/18    Authorization - Visit Number  4    Authorization - Number of Visits  10    PT Start Time  4481   Pt ran out of gas.   PT Stop Time  1304    PT Time Calculation (min)  34 min    Activity Tolerance  Patient tolerated treatment well;No increased pain    Behavior During Therapy  [facility] for tasks assessed/performed       History reviewed. No pertinent past medical history.  History reviewed. No pertinent surgical history.  There were no vitals filed for this visit.  Subjective Assessment - 07/27/18 1232    Subjective  Needling reportedly helped. This AM I am hurting.     Currently in Pain?  Yes    Pain Score  4     Pain Location  Hip    Pain Orientation  Right;Left    Pain Descriptors / Indicators  Aching    Multiple Pain Sites  No                       [facility] Adult PT Treatment/Exercise - 07/27/18 0001      Lumbar Exercises: Stretches   Single Knee to Chest Stretch  Right;Left;3 reps;20 seconds    Figure 4 Stretch  2 reps;30 seconds;With overpressure    Other Lumbar Stretch Exercise  sidelying open book 10x bil    Seated hamstring stretch 3x 10 sec     Knee/Hip Exercises: Aerobic   Nustep  L1 x 6 min       Knee/Hip Exercises: Seated   Sit to Sand  1 set;10 reps;without UE support      Knee/Hip Exercises: Supine   Other Supine Knee/Hip Exercises  B knees to chest with LE on red physioball x15 reps    Other Supine Knee/Hip Exercises  single  leg hip extension isometric hold 10x3 sec each; clamshell x15 reps each LE with red TB                PT Short Term Goals - 07/27/18 1243      PT SHORT TERM GOAL #1   Title  Pt will demo consistency and independence with her initial HEP to decrease pain and improve LE strength.    Time  3    Period  Weeks    Status  Achieved        PT Long Term Goals - 07/27/18 1244      PT LONG TERM GOAL #4   Title  Pt will report being able to stand and complete light household activity for up to 15 minutes without the need for a rest break.     Time  8    Period  Weeks    Status  Achieved   15 min but no more           Plan - 07/27/18 1231    Clinical Impression Statement  Pt thinking "maybe" her pain is better when walking. Pt can absolutely say her exercises are not making her worse. She reports hse is more compliant with her HEP now, trying to do everyday. She also reports being able to be up 15 min doing household chores now before needing to sit and rest ( LTG met.)  Pt did need to take a rest break ( about 30 sec) after each minute on the Nustep.     Rehab Potential  Good    PT Frequency  2x / week    PT Duration  8 weeks    PT Treatment/Interventions  ADLs/Self Care Home Management;Moist Heat;Iontophoresis 28m/ml Dexamethasone;Electrical Stimulation;Cryotherapy;Functional mobility training;Therapeutic activities;Therapeutic exercise;Patient/family education;Balance training;Neuromuscular re-education;Manual techniques;Passive range of motion;Dry needling    PT Next Visit Plan  Update HEP    Consulted and Agree with Plan of Care  Patient       Patient will benefit from skilled therapeutic intervention in order to improve the following deficits and impairments:  Decreased activity tolerance, Decreased strength, Impaired flexibility, Postural dysfunction, Pain, Improper body mechanics, Decreased range of motion, Decreased balance, Difficulty walking, Increased muscle spasms,  Hypomobility  Visit Diagnosis: Pain in right hip  Pain in left hip  Chronic low back pain, unspecified back pain laterality, unspecified whether sciatica present  Muscle weakness (generalized)     Problem List There are no active problems to display for this patient.   Jenissa Tyrell, PTA 07/27/2018, 1:06 PM  [facility] [address] [place] [address]: [phone]  Fax:  [phone] Name: NDARLEENE CUMPIANMRN: [id number] of Birth: [birth year]

## 2018-07-29 ENCOUNTER — Encounter: Payer: Self-pay | Admitting: Physical Therapy

## 2018-07-29 ENCOUNTER — Ambulatory Visit: Payer: Medicare Other | Admitting: Physical Therapy

## 2018-07-29 DIAGNOSIS — M545 Low back pain, unspecified: Secondary | ICD-10-CM

## 2018-07-29 DIAGNOSIS — M6281 Muscle weakness (generalized): Secondary | ICD-10-CM | POA: Diagnosis not present

## 2018-07-29 DIAGNOSIS — G8929 Other chronic pain: Secondary | ICD-10-CM | POA: Diagnosis not present

## 2018-07-29 DIAGNOSIS — M25552 Pain in left hip: Secondary | ICD-10-CM | POA: Diagnosis not present

## 2018-07-29 DIAGNOSIS — M25551 Pain in right hip: Secondary | ICD-10-CM

## 2018-07-29 NOTE — Therapy (Signed)
[facility] [address] [place], [place], [address] Phone: [phone]   Fax:  [phone]  Physical Therapy Treatment  Patient Details  Name: Alison Lewis MRN: [id number] Date of Birth: 10-07-1935 Referring Provider (PT): Suella Broad, MD   Encounter Date: 07/29/2018  PT End of Session - 07/29/18 1106    Visit Number  5    Date for PT Re-Evaluation  09/12/18    Authorization Type  Medicare     Authorization Time Period  07/12/18 to 09/12/18    Authorization - Visit Number  5    Authorization - Number of Visits  10    PT Start Time  7035    PT Stop Time  1141    PT Time Calculation (min)  39 min    Activity Tolerance  Patient tolerated treatment well;No increased pain    Behavior During Therapy  [facility] for tasks assessed/performed       History reviewed. No pertinent past medical history.  History reviewed. No pertinent surgical history.  There were no vitals filed for this visit.  Subjective Assessment - 07/29/18 1108    Subjective  My legs ached after last session.    Currently in Pain?  --   Tingle in lateral thighs   Multiple Pain Sites  No                       [facility] Adult PT Treatment/Exercise - 07/29/18 0001      Lumbar Exercises: Stretches   Lower Trunk Rotation  2 reps;10 seconds   Bil     Lumbar Exercises: Supine   Clam  10 reps   instructional cues   Bent Knee Raise  10 reps;3 seconds    Bridge  5 reps;3 seconds   2 sets     Knee/Hip Exercises: Aerobic   Nustep  L1 x 6 min       Knee/Hip Exercises: Seated   Long Arc Quad  Strengthening;Both;1 set;10 reps;Weights    Long Arc Quad Weight  2 lbs.    Clamshell with TheraBand  Red   15x   Marching  Strengthening;Both;1 set;10 reps   red band   Sit to Sand  1 set;without UE support   6 reps            PT Education - 07/29/18 1107    Education Details  HEP: standing hip abd, ext, flexion, knee flexion. Pt  demonstrated 10x of each Bil.    Person(s) Educated  Patient    Methods  Explanation;Demonstration;Tactile cues;Verbal cues;Handout    Comprehension  Verbalized understanding;Returned demonstration       PT Short Term Goals - 07/27/18 1243      PT SHORT TERM GOAL #1   Title  Pt will demo consistency and independence with her initial HEP to decrease pain and improve LE strength.    Time  3    Period  Weeks    Status  Achieved        PT Long Term Goals - 07/27/18 1244      PT LONG TERM GOAL #4   Title  Pt will report being able to stand and complete light household activity for up to 15 minutes without the need for a rest break.     Time  8    Period  Weeks    Status  Achieved   15 min but no more  Plan - 07/29/18 1106    Clinical Impression Statement  Added/progressed HEP today. Appears pt is more compliant with her HEP. Todays HEP focused on standing more, supine bridge and sit to stand. Pt reports she "feels her legs during and post sesison, this appears to be muscular and not increasing her LE symptoms.  No rest breaks on the Nustep today.    Rehab Potential  Good    PT Frequency  2x / week    PT Duration  8 weeks    PT Treatment/Interventions  ADLs/Self Care Home Management;Moist Heat;Iontophoresis 4mg /ml Dexamethasone;Electrical Stimulation;Cryotherapy;Functional mobility training;Therapeutic activities;Therapeutic exercise;Patient/family education;Balance training;Neuromuscular re-education;Manual techniques;Passive range of motion;Dry needling    PT Next Visit Plan  MMT hips, review new HEP ( questionable compliance) pt may need hamstring stretching for HEP.    Consulted and Agree with Plan of Care  Patient       Patient will benefit from skilled therapeutic intervention in order to improve the following deficits and impairments:  Decreased activity tolerance, Decreased strength, Impaired flexibility, Postural dysfunction, Pain, Improper body mechanics,  Decreased range of motion, Decreased balance, Difficulty walking, Increased muscle spasms, Hypomobility  Visit Diagnosis: Pain in right hip  Pain in left hip  Chronic low back pain, unspecified back pain laterality, unspecified whether sciatica present  Muscle weakness (generalized)     Problem List There are no active problems to display for this patient.   Alyjah Lovingood, PTA 07/29/2018, 11:46 AM  [facility] [address], [place], [place], [address] Phone: [phone]   Fax:  [phone]  Name: Alison Lewis MRN: [id number] Date of Birth: 01/23/1936  Access Code: [email or web address]  URL: [email or web address]  Date: 07/29/2018  Prepared by: Myrene Galas   Exercises  Supine Bridge - 10 reps - 1 sets - 3 hold - 1x daily - 7x weekly  Standing Hip Abduction with Counter Support - 10 reps - 1 sets - 1x daily - 7x weekly  Standing Knee Flexion Strengthening at Chair - 10 reps - 1 sets - 1x daily - 7x weekly  Standing Hip Extension with Counter Support - 10 reps - 1 sets - 1x daily - 7x weekly  Heel rises with counter support - 10 reps - 1 sets - 1x daily - 7x weekly  Sit to Stand - 5 reps - 1 sets - 2x daily - 7x weekly

## 2018-08-02 ENCOUNTER — Ambulatory Visit: Payer: Medicare Other | Admitting: Physical Therapy

## 2018-08-02 DIAGNOSIS — M25551 Pain in right hip: Secondary | ICD-10-CM | POA: Diagnosis not present

## 2018-08-02 DIAGNOSIS — G8929 Other chronic pain: Secondary | ICD-10-CM

## 2018-08-02 DIAGNOSIS — M6281 Muscle weakness (generalized): Secondary | ICD-10-CM

## 2018-08-02 DIAGNOSIS — M545 Low back pain, unspecified: Secondary | ICD-10-CM

## 2018-08-02 DIAGNOSIS — M25552 Pain in left hip: Secondary | ICD-10-CM

## 2018-08-02 NOTE — Therapy (Signed)
[facility] [address] [place], [place], [address] Phone: [phone]   Fax:  [phone]  Physical Therapy Treatment  Patient Details  Name: Alison Lewis MRN: [id number] Date of Birth: Apr 23, 1936 Referring Provider (PT): Suella Broad, MD   Encounter Date: 08/02/2018  PT End of Session - 08/02/18 1443    Visit Number  6    Date for PT Re-Evaluation  09/12/18    Authorization Type  Medicare     Authorization Time Period  07/12/18 to 09/12/18    Authorization - Visit Number  6    Authorization - Number of Visits  10    PT Start Time  1401    PT Stop Time  8250    PT Time Calculation (min)  42 min    Activity Tolerance  Patient tolerated treatment well;No increased pain    Behavior During Therapy  [facility] for tasks assessed/performed       No past medical history on file.  No past surgical history on file.  There were no vitals filed for this visit.  Subjective Assessment - 08/02/18 1403    Subjective  Pt states that her buttock area is on and off good and bad. Today is "not comfortable".     Currently in Pain?  --   not comfortable                      [facility] Adult PT Treatment/Exercise - 08/02/18 0001      Exercises   Exercises  Knee/Hip      Knee/Hip Exercises: Stretches   Other Knee/Hip Stretches  seated glute max stretch 3x10 sec each       Knee/Hip Exercises: Supine   Other Supine Knee/Hip Exercises  B clamshell with red TB 2x15 reps     Other Supine Knee/Hip Exercises  BLE on red physioball trunk rotation x20 reps (PT assistance)      Knee/Hip Exercises: Sidelying   Other Sidelying Knee/Hip Exercises  clam x10 reps with 10 sec isometric hold end range      Manual Therapy   Manual Therapy  Myofascial release    Soft tissue mobilization  STM and rolling stick along Lt ITB/lateral quad    Myofascial Release  trigger point release Lt glute med       Trigger Point Dry  Needling - 08/02/18 1439    Consent Given?  Yes    Education Handout Provided  No    Muscles Treated Lower Body  Gluteus minimus;Gluteus maximus   Lt side   Gluteus Maximus Response  Twitch response elicited;Palpable increased muscle length    Gluteus Minimus Response  Twitch response elicited;Palpable increased muscle length           PT Education - 08/02/18 1442    Education Details  HEP adherence; progression of LE strength    Person(s) Educated  Patient    Methods  Explanation;Verbal cues    Comprehension  Verbalized understanding       PT Short Term Goals - 07/27/18 1243      PT SHORT TERM GOAL #1   Title  Pt will demo consistency and independence with her initial HEP to decrease pain and improve LE strength.    Time  3    Period  Weeks    Status  Achieved        PT Long Term Goals - 07/27/18 1244      PT LONG TERM GOAL #  4   Title  Pt will report being able to stand and complete light household activity for up to 15 minutes without the need for a rest break.     Time  8    Period  Weeks    Status  Achieved   15 min but no more           Plan - 08/02/18 1443    Clinical Impression Statement  Pt continues to have discomfort in B proximal thighs. She felt that her Rt is improved following dry needling so this was completed on the Lt. Several twitch responses were noted and this was combined with other manual techniques and therex to promote hip mobility and strength. Pt had muscle fatigue with isometric clamshell exercise, but was able to complete with encouragement from the therapist. Will continue with current POC.     Rehab Potential  Good    PT Frequency  2x / week    PT Duration  8 weeks    PT Treatment/Interventions  ADLs/Self Care Home Management;Moist Heat;Iontophoresis 4mg /ml Dexamethasone;Electrical Stimulation;Cryotherapy;Functional mobility training;Therapeutic activities;Therapeutic exercise;Patient/family education;Balance training;Neuromuscular  re-education;Manual techniques;Passive range of motion;Dry needling    PT Next Visit Plan  MMT hips, review new HEP ( questionable compliance) pt may need hamstring stretching for HEP.    Consulted and Agree with Plan of Care  Patient       Patient will benefit from skilled therapeutic intervention in order to improve the following deficits and impairments:  Decreased activity tolerance, Decreased strength, Impaired flexibility, Postural dysfunction, Pain, Improper body mechanics, Decreased range of motion, Decreased balance, Difficulty walking, Increased muscle spasms, Hypomobility  Visit Diagnosis: Pain in right hip  Pain in left hip  Chronic low back pain, unspecified back pain laterality, unspecified whether sciatica present  Muscle weakness (generalized)     Problem List There are no active problems to display for this patient.    2:49 PM,08/02/18 Sherol Dade PT, DPT [place] [facility] [place]  [facility] [address], [place], [place], [address] Phone: [phone]   Fax:  [phone]  Name: TAMISHA NORDSTROM MRN: [id number] Date of Birth: 09-12-1936

## 2018-08-05 ENCOUNTER — Encounter

## 2018-08-08 ENCOUNTER — Ambulatory Visit: Payer: Medicare Other | Admitting: Physical Therapy

## 2018-08-08 ENCOUNTER — Encounter: Payer: Self-pay | Admitting: Physical Therapy

## 2018-08-08 DIAGNOSIS — G8929 Other chronic pain: Secondary | ICD-10-CM | POA: Diagnosis not present

## 2018-08-08 DIAGNOSIS — M25551 Pain in right hip: Secondary | ICD-10-CM | POA: Diagnosis not present

## 2018-08-08 DIAGNOSIS — M6281 Muscle weakness (generalized): Secondary | ICD-10-CM | POA: Diagnosis not present

## 2018-08-08 DIAGNOSIS — M545 Low back pain: Secondary | ICD-10-CM

## 2018-08-08 DIAGNOSIS — M25552 Pain in left hip: Secondary | ICD-10-CM

## 2018-08-08 NOTE — Patient Instructions (Signed)
Hip Abduction (Standing)    Stand with support. Lift right leg out to side, keeping toe forward. Alternate sides. Repeat __10_ times. Do _2__ times a day.    Copyright  VHI. All rights reserved.

## 2018-08-08 NOTE — Therapy (Signed)
[facility] [address] [place], [place], [address] Phone: [phone]   Fax:  [phone]  Physical Therapy Treatment  Patient Details  Name: Alison Lewis MRN: [id number] Date of Birth: [birth year] Referring Provider (PT): Suella Broad, MD   Encounter Date: 08/08/2018  PT End of Session - 08/08/18 6195    Visit Number  7    Date for PT Re-Evaluation  09/12/18    Authorization Type  Medicare     Authorization Time Period  07/12/18 to 09/12/18    Authorization - Visit Number  7    Authorization - Number of Visits  10    PT Start Time  0932    PT Stop Time  1316    PT Time Calculation (min)  46 min    Activity Tolerance  Patient tolerated treatment well;No increased pain    Behavior During Therapy  [facility] for tasks assessed/performed       History reviewed. No pertinent past medical history.  History reviewed. No pertinent surgical history.  There were no vitals filed for this visit.  Subjective Assessment - 08/08/18 1233    Subjective  Went to [employer] on Friday and I have not felt good since. I guess it was all the walking, about 30 minutes maybe. The needling did not seem to help like it did the first time.     Currently in Pain?  Yes    Pain Score  6     Pain Location  --   Legs   Pain Orientation  Right;Left    Pain Descriptors / Indicators  Aching;Heaviness;Radiating    Aggravating Factors   Probably walking too much in the store Friday, but it has not gotten much better since then.    Pain Relieving Factors  Not much later    Multiple Pain Sites  No         [facility] PT Assessment - 08/08/18 0001      Strength   Right Hip Flexion  4/5    Right Hip Extension  3+/5    Right Hip ABduction  3+/5    Left Hip Flexion  4/5    Left Hip Extension  3+/5    Left Hip ABduction  3+/5                   [facility] Adult PT Treatment/Exercise - 08/08/18 0001      Lumbar Exercises: Supine   Bridge  20 reps;3 seconds   Ball and glute isometric squeeze ( Bridge was painful today)     Knee/Hip Exercises: Aerobic   Nustep  L1 x 5 min   PTA present to discuss status/weekend     Knee/Hip Exercises: Standing   Hip Abduction  Stengthening;Both;2 sets;10 reps;Knee straight   Step ups Bil on black pad 10x light UE support     Knee/Hip Exercises: Supine   Other Supine Knee/Hip Exercises  B clamshell with red TB 2x15 reps       Electrical Stimulation   Electrical Stimulation Location  Lumbar to lateral hips hips in RT sidelying with pillows    Electrical Stimulation Action  IFC    Electrical Stimulation Parameters  80-150 HZ     Electrical Stimulation Goals  Pain             PT Education - 08/08/18 1304    Education Details  Standing hip abduction for HEP addition    Person(s) Educated  Patient  Methods  Explanation;Demonstration;Tactile cues;Verbal cues;Handout    Comprehension  Returned demonstration;Verbalized understanding       PT Short Term Goals - 07/27/18 1243      PT SHORT TERM GOAL #1   Title  Pt will demo consistency and independence with her initial HEP to decrease pain and improve LE strength.    Time  3    Period  Weeks    Status  Achieved        PT Long Term Goals - 07/27/18 1244      PT LONG TERM GOAL #4   Title  Pt will report being able to stand and complete light household activity for up to 15 minutes without the need for a rest break.     Time  8    Period  Weeks    Status  Achieved   15 min but no more           Plan - 08/08/18 1232    Clinical Impression Statement  With MMT today, pt demonstrates marked improvement in her hip flexors, and some improvement in her abductors and extensors  (1/2 grade). Pt was very achey in her legs today ( has been since Friday)  so we tried Estim at end of session to see if that could give her some relief. Standing hip abduction was added to HEP today.     Rehab Potential  Good    PT Frequency   2x / week    PT Duration  8 weeks    PT Treatment/Interventions  ADLs/Self Care Home Management;Moist Heat;Iontophoresis 4mg /ml Dexamethasone;Electrical Stimulation;Cryotherapy;Functional mobility training;Therapeutic activities;Therapeutic exercise;Patient/family education;Balance training;Neuromuscular re-education;Manual techniques;Passive range of motion;Dry needling    PT Next Visit Plan  Hip strength, suggest home TENS if the Estim was helpful.     PT Home Exercise Plan  unable to print due to medbridge error: supine piriformis stretch, supine clam with red TB    Consulted and Agree with Plan of Care  Patient       Patient will benefit from skilled therapeutic intervention in order to improve the following deficits and impairments:  Decreased activity tolerance, Decreased strength, Impaired flexibility, Postural dysfunction, Pain, Improper body mechanics, Decreased range of motion, Decreased balance, Difficulty walking, Increased muscle spasms, Hypomobility  Visit Diagnosis: Pain in right hip  Pain in left hip  Chronic low back pain, unspecified back pain laterality, unspecified whether sciatica present  Muscle weakness (generalized)     Problem List There are no active problems to display for this patient.   Geetika Laborde, PTA 08/08/2018, 1:11 PM  [facility] [address], [place], [place], [address] Phone: [phone]   Fax:  [phone]  Name: Alison Lewis MRN: [id number] Date of Birth: [birth year]

## 2018-08-10 ENCOUNTER — Encounter: Payer: Self-pay | Admitting: Physical Therapy

## 2018-08-10 ENCOUNTER — Ambulatory Visit: Payer: Medicare Other | Admitting: Physical Therapy

## 2018-08-10 DIAGNOSIS — G8929 Other chronic pain: Secondary | ICD-10-CM | POA: Diagnosis not present

## 2018-08-10 DIAGNOSIS — M25552 Pain in left hip: Secondary | ICD-10-CM

## 2018-08-10 DIAGNOSIS — M6281 Muscle weakness (generalized): Secondary | ICD-10-CM | POA: Diagnosis not present

## 2018-08-10 DIAGNOSIS — M545 Low back pain: Secondary | ICD-10-CM | POA: Diagnosis not present

## 2018-08-10 DIAGNOSIS — M25551 Pain in right hip: Secondary | ICD-10-CM | POA: Diagnosis not present

## 2018-08-10 NOTE — Therapy (Signed)
[facility] [address], [place], [place], [address] Phone: [phone]   Fax:  [phone]  Physical Therapy Treatment  Patient Details  Name: Alison Lewis MRN: [id number] Date of Birth: 1935/12/21 Referring Provider (PT): Suella Broad, MD   Encounter Date: 08/10/2018  PT End of Session - 08/10/18 1237    Visit Number  8    Date for PT Re-Evaluation  09/12/18    Authorization Type  Medicare     Authorization Time Period  07/12/18 to 09/12/18    Authorization - Visit Number  8    Authorization - Number of Visits  10    PT Start Time  3419    PT Stop Time  1310    PT Time Calculation (min)  34 min    Activity Tolerance  Patient tolerated treatment well;No increased pain   Pt LE fatigued at end of session, visably shakey   Behavior During Therapy  [facility] for tasks assessed/performed       History reviewed. No pertinent past medical history.  History reviewed. No pertinent surgical history.  There were no vitals filed for this visit.  Subjective Assessment - 08/10/18 1238    Subjective  I think I am recovered from walking at [place], and I think what I am feeling is more muscle soreness. I think this bc it "is new."    Currently in Pain?  --   Denies muscle back pain, has muscle soreness throughout her legs.    Multiple Pain Sites  No                       [facility] Adult PT Treatment/Exercise - 08/10/18 0001      Lumbar Exercises: Stretches   Single Knee to Chest Stretch  Right;Left;2 reps;20 seconds    Lower Trunk Rotation  --   rocking 10x slowly after bridge ex     Knee/Hip Exercises: Aerobic   Nustep  L2 x 5 min      Knee/Hip Exercises: Standing   Heel Raises  Both;2 sets;10 reps    Knee Flexion  Strengthening;Both;1 set;10 reps    Knee Flexion Limitations  2# wts    Hip Abduction  AROM;Stengthening;Both;2 sets;10 reps;Knee straight   Declined trying weights today for  resistance     Knee/Hip Exercises: Seated   Long Arc Quad  Strengthening;Both;2 sets;10 reps;Weights    Long Arc Quad Weight  3 lbs.    Clamshell with TheraBand  Green   15x   Sit to Sand  1 set;10 reps;with UE support   40%-50% UE     Knee/Hip Exercises: Supine   Bridges  Strengthening;Both;2 sets;5 reps    Other Supine Knee/Hip Exercises  SLR 5x bil with VC for abdominals               PT Short Term Goals - 07/27/18 1243      PT SHORT TERM GOAL #1   Title  Pt will demo consistency and independence with her initial HEP to decrease pain and improve LE strength.    Time  3    Period  Weeks    Status  Achieved        PT Long Term Goals - 07/27/18 1244      PT LONG TERM GOAL #4   Title  Pt will report being able to stand and complete light household activity for up to 15 minutes without the need for a  rest break.     Time  8    Period  Weeks    Status  Achieved   15 min but no more           Plan - 08/10/18 1617    Clinical Impression Statement  Pt was given light weights today for a few exercises to progress her LE strength, others she felt adding weights would be too much and elected to do more reps or just move against gravity ( standing hip abduction) Today pt was able to supine bridge without pain stopping her. Pt verbalized feeling a little down that her legs still hurt ( despite thinkning this is more muscluar soreness now). PTA encouraged pt to not give up, explained she was very low level with her strength when she first started and gains will be slow. Also encouraged better home compliance.     Rehab Potential  Good    PT Frequency  2x / week    PT Duration  8 weeks    PT Treatment/Interventions  ADLs/Self Care Home Management;Moist Heat;Iontophoresis 4mg /ml Dexamethasone;Electrical Stimulation;Cryotherapy;Functional mobility training;Therapeutic activities;Therapeutic exercise;Patient/family education;Balance training;Neuromuscular re-education;Manual  techniques;Passive range of motion;Dry needling    PT Next Visit Plan  Hip strength, see how pt did with weights used today. Pt is a little sad she isn't significantly stronger, may need pep talk/encouragement.    PT Home Exercise Plan  unable to print due to medbridge error: supine piriformis stretch, supine clam with red TB    Consulted and Agree with Plan of Care  Patient       Patient will benefit from skilled therapeutic intervention in order to improve the following deficits and impairments:  Decreased activity tolerance, Decreased strength, Impaired flexibility, Postural dysfunction, Pain, Improper body mechanics, Decreased range of motion, Decreased balance, Difficulty walking, Increased muscle spasms, Hypomobility  Visit Diagnosis: Pain in right hip  Pain in left hip  Chronic low back pain, unspecified back pain laterality, unspecified whether sciatica present  Muscle weakness (generalized)     Problem List There are no active problems to display for this patient.   COCHRAN,JENNIFER, PTA 08/10/2018, 4:51 PM  [facility] [address], [place], [place], [address] Phone: [phone]   Fax:  [phone]  Name: Alison Lewis MRN: [id number] Date of Birth: [birth year]

## 2018-08-15 ENCOUNTER — Encounter: Payer: Self-pay | Admitting: Physical Therapy

## 2018-08-15 ENCOUNTER — Ambulatory Visit: Payer: Medicare Other | Admitting: Physical Therapy

## 2018-08-15 DIAGNOSIS — M25551 Pain in right hip: Secondary | ICD-10-CM | POA: Diagnosis not present

## 2018-08-15 DIAGNOSIS — M545 Low back pain: Secondary | ICD-10-CM | POA: Diagnosis not present

## 2018-08-15 DIAGNOSIS — G8929 Other chronic pain: Secondary | ICD-10-CM | POA: Diagnosis not present

## 2018-08-15 DIAGNOSIS — M25552 Pain in left hip: Secondary | ICD-10-CM

## 2018-08-15 DIAGNOSIS — M6281 Muscle weakness (generalized): Secondary | ICD-10-CM

## 2018-08-15 NOTE — Therapy (Signed)
[facility] [address] [place], [place], [address] Phone: [phone]   Fax:  [phone]  Physical Therapy Treatment  Patient Details  Name: Alison Lewis MRN: [id number] Date of Birth: 1936-03-25 Referring Provider (PT): Suella Broad, MD  Progress Note Reporting Period 07/12/18 to 08/15/18  See note below for Objective Data and Assessment of Progress/Goals.      Encounter Date: 08/15/2018  PT End of Session - 08/15/18 1238    Visit Number  9    Date for PT Re-Evaluation  09/12/18    Authorization Type  Medicare     Authorization Time Period  07/12/18 to 09/12/18    Authorization - Visit Number  9    Authorization - Number of Visits  19    PT Start Time  3151    PT Stop Time  1309    PT Time Calculation (min)  38 min    Activity Tolerance  Patient tolerated treatment well;No increased pain   Pt LE fatigued at end of session, visably shakey   Behavior During Therapy  [facility] for tasks assessed/performed       History reviewed. No pertinent past medical history.  History reviewed. No pertinent surgical history.  There were no vitals filed for this visit.  Subjective Assessment - 08/15/18 1234    Subjective  Pt reports that she went to the grocery store yesterday and her legs got really sore. She has no pain currently, but she does have discomfort when walking and standing.     Currently in Pain?  No/denies         [facility] PT Assessment - 08/15/18 0001      Strength   Right Hip Flexion  4/5    Right Hip Extension  3+/5    Right Hip ABduction  3+/5    Left Hip Flexion  4/5    Left Hip Extension  3+/5    Left Hip ABduction  3+/5                   [facility] Adult PT Treatment/Exercise - 08/15/18 0001      Exercises   Exercises  Knee/Hip      Knee/Hip Exercises: Stretches   Piriformis Stretch  2 reps;Both;20 seconds    Piriformis Stretch Limitations  seated     Other Knee/Hip  Stretches  standing hip adductor stretch 2x20 sec      Knee/Hip Exercises: Standing   Hip Abduction  Stengthening;Both;2 sets;10 reps;Knee straight    Abduction Limitations  yellow TB around ankles, pt only agreeable to 5 reps for 2nd set     Other Standing Knee Exercises  hip abduction/adduction slide 2x10 reps each       Knee/Hip Exercises: Seated   Other Seated Knee/Hip Exercises  hip ER ankle press into ball 10x5 sec, 8x5 sec without ball       Knee/Hip Exercises: Supine   Other Supine Knee/Hip Exercises  B clamshell with red TB x10 reps, second set x5 reps     Other Supine Knee/Hip Exercises  B knees to chest with red physioball and therapist providing overpressure x15 reps; SKTC 5x10 sec hold each LE             PT Education - 08/15/18 1304    Education Details  technique with therex; importance of pushing herself during sessions in order to increase progress towards goals    Person(s) Educated  Patient    Methods  Explanation  Comprehension  Verbalized understanding       PT Short Term Goals - 08/15/18 1310      PT SHORT TERM GOAL #1   Title  Pt will demo consistency and independence with her initial HEP to decrease pain and improve LE strength.    Time  3    Period  Weeks    Status  Achieved        PT Long Term Goals - 08/15/18 1310      PT LONG TERM GOAL #1   Title  Pt will have increased hamstring flexibility to lacking no more than 30 deg with 90/90 testing, which will improve her standing posture throughout the day.    Time  8    Period  Weeks    Status  New      PT LONG TERM GOAL #2   Title  Pt will demo improved BLE strength and power evident by her ability to complete 5x sit to stand in less than 14 sec without UE support or significant knee valgus deviation.     Time  8    Period  Weeks    Status  New      PT LONG TERM GOAL #3   Title  Pt will have atleast 4/5 MMT strenght of the LEs which will improve her efficiency with daily tasks.    Time   8    Period  Weeks    Status  Partially Met      PT LONG TERM GOAL #4   Title  Pt will report being able to stand and complete light household activity for up to 15 minutes without the need for a rest break.     Baseline  15 minutes    Time  8    Period  Weeks    Status  Achieved      PT LONG TERM GOAL #5   Title  Pt will report being able to ascend and descend her steps at home, x1 trial, without increase in BLE pain.     Time  8    Period  Weeks    Status  On-going            Plan - 08/15/18 1312    Clinical Impression Statement  Pt is making slow progress towards her goals due to high levels of inactivity and the chronic nature of her pain prior to start of PT. Pt does have improvements in activity tolerance during the day, reporting being able to complete greater than 15 minutes of activity in her home without a seated break. Therapist continues to encourage HEP adherence and there have been some strength improvements with MMT particularly. Pt does still have limitations in hip flexibility, hip strength and limited activity tolerance out in the community and would continue to benefit from skilled PT to address her limitations for improved independence moving forward with her POC.     Rehab Potential  Good    PT Frequency  2x / week    PT Duration  8 weeks    PT Treatment/Interventions  ADLs/Self Care Home Management;Moist Heat;Iontophoresis 13m/ml Dexamethasone;Electrical Stimulation;Cryotherapy;Functional mobility training;Therapeutic activities;Therapeutic exercise;Patient/family education;Balance training;Neuromuscular re-education;Manual techniques;Passive range of motion;Dry needling    PT Next Visit Plan  5x sit to stand; Hip strength progression with resistance, continue to provide encouragement as needed.    PT Home Exercise Plan  unable to print due to medbridge error: supine piriformis stretch, supine clam with red TB  Consulted and Agree with Plan of Care  Patient        Patient will benefit from skilled therapeutic intervention in order to improve the following deficits and impairments:  Decreased activity tolerance, Decreased strength, Impaired flexibility, Postural dysfunction, Pain, Improper body mechanics, Decreased range of motion, Decreased balance, Difficulty walking, Increased muscle spasms, Hypomobility  Visit Diagnosis: Pain in right hip  Pain in left hip  Muscle weakness (generalized)  Chronic low back pain, unspecified back pain laterality, unspecified whether sciatica present     Problem List There are no active problems to display for this patient.   1:27 PM,08/15/18 [place], DPT [place] [facility] [place]  [facility] [address], [place], [place], [address] Phone: [phone]   Fax:  [phone]  Name: Alison Lewis MRN: [id number] Date of Birth: [birth year]

## 2018-08-17 ENCOUNTER — Encounter: Payer: Medicare Other | Admitting: Physical Therapy

## 2018-08-22 ENCOUNTER — Encounter: Payer: Self-pay | Admitting: Physical Therapy

## 2018-08-22 ENCOUNTER — Ambulatory Visit: Payer: Medicare Other | Attending: Physical Medicine and Rehabilitation | Admitting: Physical Therapy

## 2018-08-22 DIAGNOSIS — G8929 Other chronic pain: Secondary | ICD-10-CM

## 2018-08-22 DIAGNOSIS — M545 Low back pain, unspecified: Secondary | ICD-10-CM

## 2018-08-22 DIAGNOSIS — M6281 Muscle weakness (generalized): Secondary | ICD-10-CM | POA: Insufficient documentation

## 2018-08-22 DIAGNOSIS — M25552 Pain in left hip: Secondary | ICD-10-CM | POA: Diagnosis not present

## 2018-08-22 DIAGNOSIS — M25551 Pain in right hip: Secondary | ICD-10-CM

## 2018-08-22 NOTE — Therapy (Signed)
[facility] [address] [place], [place], [address] Phone: [phone]   Fax:  [phone]  Physical Therapy Treatment  Patient Details  Name: Alison Lewis MRN: [id number] Date of Birth: 03/06/1936 Referring Provider (PT): Suella Broad, MD   Encounter Date: 08/22/2018  PT End of Session - 08/22/18 1236    Visit Number  10    Date for PT Re-Evaluation  09/12/18    Authorization Type  Medicare     Authorization Time Period  07/12/18 to 09/12/18    Authorization - Visit Number  10    Authorization - Number of Visits  19    PT Start Time  4656    PT Stop Time  8127    PT Time Calculation (min)  42 min    Activity Tolerance  Patient tolerated treatment well;No increased pain    Behavior During Therapy  [facility] for tasks assessed/performed       History reviewed. No pertinent past medical history.  History reviewed. No pertinent surgical history.  There were no vitals filed for this visit.  Subjective Assessment - 08/22/18 1244    Subjective  My legs were really hurting after last session through Thursday.    Currently in Pain?  Yes    Pain Score  3     Pain Location  Hip    Pain Orientation  Right;Left    Pain Descriptors / Indicators  Aching    Aggravating Factors   Too much walking    Pain Relieving Factors  Not much    Multiple Pain Sites  No                       [facility] Adult PT Treatment/Exercise - 08/22/18 0001      Lumbar Exercises: Stretches   Single Knee to Chest Stretch  Right;Left;2 reps;20 seconds    Lower Trunk Rotation  --   rocking 10x slowly after bridge ex     Knee/Hip Exercises: Stretches   Piriformis Stretch  2 reps;Both;20 seconds    Piriformis Stretch Limitations  seated     Other Knee/Hip Stretches  hamstring stretches bil 2x 30 sec      Knee/Hip Exercises: Aerobic   Nustep  L2x 10 min   PTA present to discuss status and how she did after last      Knee/Hip  Exercises: Standing   Heel Raises  Both;1 set;20 reps    Knee Flexion  Strengthening;Both;2 sets;10 reps    Knee Flexion Limitations  2#    Hip Abduction  Stengthening;Both;2 sets;10 reps;Knee straight   2#      Knee/Hip Exercises: Seated   Long Arc Quad  Strengthening;Both;1 set;10 reps;Weights    Long Arc Quad Weight  3 lbs.      Knee/Hip Exercises: Supine   Other Supine Knee/Hip Exercises  B clamshell with greenTB 2  x10 reps, second set x5 reps                PT Short Term Goals - 08/15/18 1310      PT SHORT TERM GOAL #1   Title  Pt will demo consistency and independence with her initial HEP to decrease pain and improve LE strength.    Time  3    Period  Weeks    Status  Achieved        PT Long Term Goals - 08/15/18 1310      PT LONG TERM  GOAL #1   Title  Pt will have increased hamstring flexibility to lacking no more than 30 deg with 90/90 testing, which will improve her standing posture throughout the day.    Time  8    Period  Weeks    Status  New      PT LONG TERM GOAL #2   Title  Pt will demo improved BLE strength and power evident by her ability to complete 5x sit to stand in less than 14 sec without UE support or significant knee valgus deviation.     Time  8    Period  Weeks    Status  New      PT LONG TERM GOAL #3   Title  Pt will have atleast 4/5 MMT strenght of the LEs which will improve her efficiency with daily tasks.    Time  8    Period  Weeks    Status  Partially Met      PT LONG TERM GOAL #4   Title  Pt will report being able to stand and complete light household activity for up to 15 minutes without the need for a rest break.     Baseline  15 minutes    Time  8    Period  Weeks    Status  Achieved      PT LONG TERM GOAL #5   Title  Pt will report being able to ascend and descend her steps at home, x1 trial, without increase in BLE pain.     Time  8    Period  Weeks    Status  On-going            Plan - 08/22/18 1311     Clinical Impression Statement  Pt had increased leg pain last week that made for several miserbale days. She reports today she is willing to work but she does not want to hurt for several days after. She felt the ankle weights were easier on her legs than the band so we went with that.     Rehab Potential  Good    PT Frequency  2x / week    PT Duration  8 weeks    PT Treatment/Interventions  ADLs/Self Care Home Management;Moist Heat;Iontophoresis 4mg/ml Dexamethasone;Electrical Stimulation;Cryotherapy;Functional mobility training;Therapeutic activities;Therapeutic exercise;Patient/family education;Balance training;Neuromuscular re-education;Manual techniques;Passive range of motion;Dry needling    PT Next Visit Plan  5x sit to stand; Hip strength progression with resistance, continue to provide encouragement as needed.    Consulted and Agree with Plan of Care  Patient       Patient will benefit from skilled therapeutic intervention in order to improve the following deficits and impairments:  Decreased activity tolerance, Decreased strength, Impaired flexibility, Postural dysfunction, Pain, Improper body mechanics, Decreased range of motion, Decreased balance, Difficulty walking, Increased muscle spasms, Hypomobility  Visit Diagnosis: Pain in right hip  Pain in left hip  Muscle weakness (generalized)  Chronic low back pain, unspecified back pain laterality, unspecified whether sciatica present     Problem List There are no active problems to display for this patient.   COCHRAN,JENNIFER, PTA 08/22/2018, 3:33 PM  [facility] [address], [address] [place], [place], [address] Phone: [phone]   Fax:  [phone]  Name: Alison Lewis MRN: [id number] Date of Birth: 09/20/1936   

## 2018-08-24 ENCOUNTER — Encounter: Payer: Self-pay | Admitting: Physical Therapy

## 2018-08-24 ENCOUNTER — Ambulatory Visit: Payer: Medicare Other | Admitting: Physical Therapy

## 2018-08-24 DIAGNOSIS — G8929 Other chronic pain: Secondary | ICD-10-CM | POA: Diagnosis not present

## 2018-08-24 DIAGNOSIS — M6281 Muscle weakness (generalized): Secondary | ICD-10-CM | POA: Diagnosis not present

## 2018-08-24 DIAGNOSIS — M545 Low back pain, unspecified: Secondary | ICD-10-CM

## 2018-08-24 DIAGNOSIS — M25551 Pain in right hip: Secondary | ICD-10-CM | POA: Diagnosis not present

## 2018-08-24 DIAGNOSIS — M25552 Pain in left hip: Secondary | ICD-10-CM | POA: Diagnosis not present

## 2018-08-24 NOTE — Therapy (Signed)
[facility] [address] [place], [place], [address] Phone: [phone]   Fax:  [phone]  Physical Therapy Treatment  Patient Details  Name: Alison Lewis MRN: [id number] Date of Birth: 06-Sep-1936 Referring Provider (PT): Suella Broad, MD   Encounter Date: 08/24/2018  PT End of Session - 08/24/18 1234    Visit Number  11    Date for PT Re-Evaluation  09/12/18    Authorization Type  Medicare     Authorization Time Period  07/12/18 to 09/12/18    Authorization - Visit Number  11    Authorization - Number of Visits  19    PT Start Time  1324    PT Stop Time  1310    PT Time Calculation (min)  39 min    Activity Tolerance  Patient tolerated treatment well;No increased pain    Behavior During Therapy  [facility] for tasks assessed/performed       History reviewed. No pertinent past medical history.  History reviewed. No pertinent surgical history.  There were no vitals filed for this visit.  Subjective Assessment - 08/24/18 1235    Subjective  No new complaints, sore after last session, probably muscles she thinks.     Currently in Pain?  Yes    Pain Score  3     Pain Location  Hip    Pain Orientation  Right;Left    Pain Descriptors / Indicators  Aching    Multiple Pain Sites  No                       [facility] Adult PT Treatment/Exercise - 08/24/18 0001      Knee/Hip Exercises: Aerobic   Nustep  L2x 10 min   PTA present to discuss status and how she did after last      Knee/Hip Exercises: Machines for Strengthening   Total Gym Leg Press  Seat 7 Bil 40# 10x      Knee/Hip Exercises: Standing   Heel Raises  Both;1 set;20 reps    Hip Abduction  Stengthening;Both;1 set;10 reps;Knee straight   3#   Other Standing Knee Exercises  6 inch steps ups Bil 2 UE   LE very shakey at quads, medial collapse LT> RT     Knee/Hip Exercises: Seated   Long Arc Quad  Strengthening;Both;2 sets;10 reps;Weights     Long Arc Quad Weight  3 lbs.    Clamshell with TheraBand  Green   15x2   Marching  Strengthening;Both;2 sets;10 reps;Weights    Marching Weights  3 lbs.   Seated on black pad              PT Short Term Goals - 08/15/18 1310      PT SHORT TERM GOAL #1   Title  Pt will demo consistency and independence with her initial HEP to decrease pain and improve LE strength.    Time  3    Period  Weeks    Status  Achieved        PT Long Term Goals - 08/24/18 1418      PT LONG TERM GOAL #5   Title  Pt will report being able to ascend and descend her steps at home, x1 trial, without increase in BLE pain.     Time  8    Period  Weeks    Status  Partially Met   Pt reports she is going up and down her  stairs  a few times throughout the day sometimes LE hurt, sometimes it is better.            Plan - 08/24/18 1234    Clinical Impression Statement  Pt reports not a bad week, in fact she is trying to do her stretches more often, she is even talking about re-joining [employer]neakers once PT is finished. She was able to perform her hip strength exercises in standing with more resistance but is very concerned how she will feel the next day. We added today step ups and the leg press. LE were visably shakey with medial knee colllapse LT> RT on the steps.     Rehab Potential  Good    PT Frequency  2x / week    PT Duration  8 weeks    PT Treatment/Interventions  ADLs/Self Care Home Management;Moist Heat;Iontophoresis 29m/ml Dexamethasone;Electrical Stimulation;Cryotherapy;Functional mobility training;Therapeutic activities;Therapeutic exercise;Patient/family education;Balance training;Neuromuscular re-education;Manual techniques;Passive range of motion;Dry needling    PT Next Visit Plan  5x sit to stand; Hip strength progression with resistance, continue to provide encouragement as needed.    PT Home Exercise Plan  unable to print due to medbridge error: supine piriformis stretch, supine  clam with red TB    Consulted and Agree with Plan of Care  Patient       Patient will benefit from skilled therapeutic intervention in order to improve the following deficits and impairments:  Decreased activity tolerance, Decreased strength, Impaired flexibility, Postural dysfunction, Pain, Improper body mechanics, Decreased range of motion, Decreased balance, Difficulty walking, Increased muscle spasms, Hypomobility  Visit Diagnosis: Pain in right hip  Pain in left hip  Muscle weakness (generalized)  Chronic low back pain, unspecified back pain laterality, unspecified whether sciatica present     Problem List There are no active problems to display for this patient.   Alison Lewis, PTA 08/24/2018, 2:20 PM   [facility] [address] [place] [address]Phone: [phone]  Fax:  [phone] Name: Alison GIRGISMRN: [id number] of Birth: [birth year]

## 2018-08-29 ENCOUNTER — Ambulatory Visit: Payer: Medicare Other | Admitting: Physical Therapy

## 2018-08-29 ENCOUNTER — Encounter: Payer: Self-pay | Admitting: Physical Therapy

## 2018-08-29 DIAGNOSIS — M25551 Pain in right hip: Secondary | ICD-10-CM | POA: Diagnosis not present

## 2018-08-29 DIAGNOSIS — M25552 Pain in left hip: Secondary | ICD-10-CM

## 2018-08-29 DIAGNOSIS — G8929 Other chronic pain: Secondary | ICD-10-CM

## 2018-08-29 DIAGNOSIS — M545 Low back pain: Secondary | ICD-10-CM | POA: Diagnosis not present

## 2018-08-29 DIAGNOSIS — M6281 Muscle weakness (generalized): Secondary | ICD-10-CM

## 2018-08-29 NOTE — Therapy (Signed)
[facility] [address], [place], [place], [address] Phone: [phone]   Fax:  [phone]  Physical Therapy Treatment  Patient Details  Name: Alison Lewis MRN: [id number] Date of Birth: April 29, 1936 Referring Provider (PT): Suella Broad, MD   Encounter Date: 08/29/2018  PT End of Session - 08/29/18 1228    Visit Number  12    Date for PT Re-Evaluation  09/12/18    Authorization Type  Medicare     Authorization Time Period  07/12/18 to 09/12/18    Authorization - Visit Number  12    Authorization - Number of Visits  19    PT Start Time  1228    PT Stop Time  3704    PT Time Calculation (min)  39 min    Activity Tolerance  Patient tolerated treatment well;No increased pain    Behavior During Therapy  [facility] for tasks assessed/performed       History reviewed. No pertinent past medical history.  History reviewed. No pertinent surgical history.  There were no vitals filed for this visit.  Subjective Assessment - 08/29/18 1230    Subjective  Sore again. I want to go to [employer] after so don't kill my legs.     Limitations  House hold activities;Standing;Walking    Currently in Pain?  Yes    Pain Score  3     Pain Location  Hip    Pain Orientation  Right;Left    Pain Descriptors / Indicators  Aching;Heaviness    Aggravating Factors   Too much walking    Pain Relieving Factors  Exercises?    Multiple Pain Sites  No                       [facility] Adult PT Treatment/Exercise - 08/29/18 0001      Lumbar Exercises: Stretches   Single Knee to Chest Stretch  Right;Left;2 reps;20 seconds    Lower Trunk Rotation  3 reps;20 seconds      Knee/Hip Exercises: Aerobic   Nustep  L2 x 5 min per pt request   PTA present      Knee/Hip Exercises: Standing   Knee Flexion  Strengthening;Both;1 set;10 reps    Knee Flexion Limitations  3#    Hip Abduction  Stengthening;Both;2 sets;10 reps;Knee straight    3#   Other Standing Knee Exercises  6 inch forward step ups, 10x bil 2 rails used. Lt medial knee collapse, RT did better.      Knee/Hip Exercises: Seated   Long Arc Quad  Strengthening;Both;1 set;10 reps;Weights   One set per pt request   Long Arc Quad Weight  3 lbs.    Clamshell with [employer]raBand  Red   15x2   Marching  Strengthening;Both;1 set;10 reps;Weights    Marching Weights  3 lbs.   One set per pt request today              PT Short Term Goals - 08/15/18 1310      PT SHORT TERM GOAL #1   Title  Pt will demo consistency and independence with her initial HEP to decrease pain and improve LE strength.    Time  3    Period  Weeks    Status  Achieved        PT Long Term Goals - 08/24/18 1418      PT LONG TERM GOAL #5   Title  Pt will report  being able to ascend and descend her steps at home, x1 trial, without increase in BLE pain.     Time  8    Period  Weeks    Status  Partially Met   Pt reports she is going up and down her stairs  a few times throughout the day sometimes LE hurt, sometimes it is better.            Plan - 08/29/18 1228    Clinical Impression Statement  Legs, hips still "hurt" especially day after PT session. Pt demonstrates hip weakness when performing her step up exercise, medial collapse appears >on the LT. Pt wanted to go shopping after session so per her request we dialed back some exercises although pt was able to perform some of her standing exercises with slightly more resistance. Held on leg press today but pt seemed to like doing that exercise.      Rehab Potential  Good    PT Frequency  2x / week    PT Duration  8 weeks    PT Treatment/Interventions  ADLs/Self Care Home Management;Moist Heat;Iontophoresis 47m/ml Dexamethasone;Electrical Stimulation;Cryotherapy;Functional mobility training;Therapeutic activities;Therapeutic exercise;Patient/family education;Balance training;Neuromuscular re-education;Manual techniques;Passive range  of motion;Dry needling    PT Next Visit Plan  See how pt's legs did after PT and walking in the store. Do 5x sit to stand test for LTG. Standing hip stabiliaztion, hip abduction, ER strength. Pt order good through 12/23.     PT Home Exercise Plan  unable to print due to medbridge error: supine piriformis stretch, supine clam with red TB    Consulted and Agree with Plan of Care  Patient       Patient will benefit from skilled therapeutic intervention in order to improve the following deficits and impairments:  Decreased activity tolerance, Decreased strength, Impaired flexibility, Postural dysfunction, Pain, Improper body mechanics, Decreased range of motion, Decreased balance, Difficulty walking, Increased muscle spasms, Hypomobility  Visit Diagnosis: Pain in right hip  Pain in left hip  Muscle weakness (generalized)  Chronic low back pain, unspecified back pain laterality, unspecified whether sciatica present     Problem List There are no active problems to display for this patient.   ,, PTA 08/29/2018, 2:34 PM  [facility] [address] [place] [address]Phone: [phone]  Fax:  [phone] Name: Alison ERNYMRN: [id number] of Birth: 307-05-1936

## 2018-09-01 ENCOUNTER — Encounter: Payer: Self-pay | Admitting: Physical Therapy

## 2018-09-01 ENCOUNTER — Ambulatory Visit: Payer: Medicare Other | Admitting: Physical Therapy

## 2018-09-01 DIAGNOSIS — M25552 Pain in left hip: Secondary | ICD-10-CM | POA: Diagnosis not present

## 2018-09-01 DIAGNOSIS — M6281 Muscle weakness (generalized): Secondary | ICD-10-CM

## 2018-09-01 DIAGNOSIS — M545 Low back pain, unspecified: Secondary | ICD-10-CM

## 2018-09-01 DIAGNOSIS — M25551 Pain in right hip: Secondary | ICD-10-CM | POA: Diagnosis not present

## 2018-09-01 DIAGNOSIS — G8929 Other chronic pain: Secondary | ICD-10-CM

## 2018-09-01 NOTE — Therapy (Signed)
[facility] [address], [place], [place], [address] Phone: [phone]   Fax:  [phone]  Physical Therapy Treatment/Re-evaluation  Patient Details  Name: Alison Lewis MRN: [id number] Date of Birth: 1936/02/20 Referring Provider (PT): Suella Broad, MD   Encounter Date: 09/01/2018  PT End of Session - 09/01/18 1313    Visit Number  13    Date for PT Re-Evaluation  09/12/18    Authorization Type  Medicare     Authorization Time Period  09/13/18 to 10/28/18    Authorization - Visit Number  13    Authorization - Number of Visits  30    PT Start Time  3491   Pt arrived late and filling out paperwork   PT Stop Time  1310    PT Time Calculation (min)  30 min    Activity Tolerance  Patient tolerated treatment well;No increased pain    Behavior During Therapy  [facility] for tasks assessed/performed       History reviewed. No pertinent past medical history.  History reviewed. No pertinent surgical history.  There were no vitals filed for this visit.  Subjective Assessment - 09/01/18 1244    Subjective  Pt reports that things are going well. She has no pain currently. She feels that she is able to do more at around the house and at the grocery store. She is completing her HEP a little more recently.     Limitations  House hold activities;Standing;Walking    How long can you stand comfortably?  10 minutes    Currently in Pain?  No/denies         [facility] PT Assessment - 09/01/18 0001      Assessment   Medical Diagnosis  intervertebral disc degeneration    Referring Provider (PT)  Suella Broad, MD    Onset Date/Surgical Date  --   10+ years ago   Next MD Visit  sometime in November     Prior Therapy  some with improvement       Precautions   Precautions  None      Balance Screen   Has the patient fallen in the past 6 months  No    Has the patient had a decrease in activity level because of a fear of falling?    No    Is the patient reluctant to leave their home because of a fear of falling?   No      Home Film/video editor residence    Living Arrangements  Spouse/significant other    Additional Comments  1 flight of stairs, does have pain when going up      Prior Function   Level of Independence  Independent      Cognition   Overall Cognitive Status  Within Functional Limits for tasks assessed      AROM   Lumbar Flexion  limited, pulling stretch    Lumbar - Right Rotation  limited painful    Lumbar - Left Rotation  limited painful      Strength   Right Hip Flexion  5/5    Right Hip Extension  3+/5    Right Hip ABduction  3+/5    Left Hip Flexion  5/5    Left Hip Extension  3+/5    Left Hip ABduction  3+/5      Flexibility   Soft Tissue Assessment /Muscle Length  yes    Hamstrings  45 deg  lacking BLE      Palpation   Palpation comment  tenderness along lateral quadriceps, ITB, glute med      Special Tests   Other special tests  slump negative Lt and Rt (+) neural tension noted                   [facility] Adult PT Treatment/Exercise - 09/01/18 0001      Transfers   Five time sit to stand comments   15 sec      Manual Therapy   Soft tissue mobilization  STM Lt and Rt lateral quads    Myofascial Release  trigger point release Lt and Rt gluteals             PT Education - 09/01/18 1407    Education Details  progress towards goals    Person(s) Educated  Patient    Methods  Explanation    Comprehension  Verbalized understanding       PT Short Term Goals - 09/01/18 1409      PT SHORT TERM GOAL #1   Title  Pt will demo consistency and independence with her initial HEP to decrease pain and improve LE strength.    Time  3    Period  Weeks    Status  Achieved        PT Long Term Goals - 09/01/18 1409      PT LONG TERM GOAL #1   Title  Pt will have increased hamstring flexibility to lacking no more than 30 deg with 90/90  testing, which will improve her standing posture throughout the day.    Baseline  45 deg lacking    Time  8    Period  Weeks    Status  On-going      PT LONG TERM GOAL #2   Title  Pt will demo improved BLE strength and power evident by her ability to complete 5x sit to stand in less than 14 sec without UE support or significant knee valgus deviation.     Baseline  15 sec    Time  8    Period  Weeks    Status  Partially Met      PT LONG TERM GOAL #3   Title  Pt will have atleast 4/5 MMT strenght of the LEs which will improve her efficiency with daily tasks.    Baseline  hip flexion 5/5 MMT    Time  8    Period  Weeks    Status  Partially Met      PT LONG TERM GOAL #4   Title  Pt will report being able to stand and complete light household activity for up to 15 minutes without the need for a rest break.     Baseline  anywhere from 10-15 min but not consistently    Time  8    Period  Weeks    Status  Partially Met      PT LONG TERM GOAL #5   Title  Pt will report being able to ascend and descend her steps at home, x1 trial, without increase in BLE pain.     Baseline  pt feels improved but it fluctuates    Time  8    Period  Weeks    Status  On-going            Plan - 09/01/18 1307    Clinical Impression Statement  Pt was re-evaluated this visit having made  progress towards her long term goals. She has recently increased HEP adherence and has noticed an improvement in her ability to complete chores around the house and grocery store with fewer rest breaks. Pt's hip strength has increased atleast 1 muscle grade with hip flexion, and there are some improvements in hip extension/abduction strength as well. Pt's flexibility remains a limitation as well as her functional strength, with 5x sit to stand and noted with muscle fatigue during her sessions. She would continue to benefit from skilled PT, as she remains limited in her ability to complete grocery shopping without a rest beak  and increase in pain. Pt was agreeable with this and verbalized understanding of HEP updates provided this session.     Rehab Potential  Good    PT Frequency  2x / week    PT Duration  6 weeks    PT Treatment/Interventions  ADLs/Self Care Home Management;Moist Heat;Iontophoresis 66m/ml Dexamethasone;Electrical Stimulation;Cryotherapy;Functional mobility training;Therapeutic activities;Therapeutic exercise;Patient/family education;Balance training;Neuromuscular re-education;Manual techniques;Passive range of motion;Dry needling    PT Next Visit Plan  hamstring stretch; hip abductor/extensor stretch (possible isometric); progress HEP to green TB if able    PT Home Exercise Plan  [email or web address]    Consulted and Agree with Plan of Care  Patient       Patient will benefit from skilled therapeutic intervention in order to improve the following deficits and impairments:  Decreased activity tolerance, Decreased strength, Impaired flexibility, Postural dysfunction, Pain, Improper body mechanics, Decreased range of motion, Decreased balance, Difficulty walking, Increased muscle spasms, Hypomobility  Visit Diagnosis: Pain in right hip  Pain in left hip  Muscle weakness (generalized)  Chronic low back pain, unspecified back pain laterality, unspecified whether sciatica present     Problem List There are no active problems to display for this patient.   2:44 PM,09/01/18 Alison DadePT, DPT [facility]ison [facility] [address] [place] [address]Phone: [phone]  Fax:  [phone] Name: Alison FAIRMRN: [id number] of Birth: [birth year]

## 2018-09-01 NOTE — Patient Instructions (Signed)
Access Code: [email or web address]  URL: [email or web address]  Date: 09/01/2018  Prepared by: Sherol Dade   Exercises  Bent Knee Fallouts - 10 reps - 2 sets - 1x daily - 7x weekly  Supine Piriformis Stretch with Foot on Ground - 10 reps - 2 sets - 1x daily - 7x weekly  Supine Figure 4 Piriformis Stretch - 10 reps - 2 sets - 1x daily - 7x weekly  Supine Bridge - 10 reps - 2 sets - 1x daily - 7x weekly    [facility] [address] [place], [place] [address] Phone # [phone] Fax [phone]

## 2018-09-05 ENCOUNTER — Ambulatory Visit: Payer: Medicare Other | Admitting: Physical Therapy

## 2018-09-05 ENCOUNTER — Encounter: Payer: Self-pay | Admitting: Physical Therapy

## 2018-09-05 DIAGNOSIS — G8929 Other chronic pain: Secondary | ICD-10-CM

## 2018-09-05 DIAGNOSIS — M545 Low back pain, unspecified: Secondary | ICD-10-CM

## 2018-09-05 DIAGNOSIS — M25552 Pain in left hip: Secondary | ICD-10-CM

## 2018-09-05 DIAGNOSIS — M25551 Pain in right hip: Secondary | ICD-10-CM

## 2018-09-05 DIAGNOSIS — M6281 Muscle weakness (generalized): Secondary | ICD-10-CM | POA: Diagnosis not present

## 2018-09-05 NOTE — Therapy (Signed)
[facility] [address], [place], [place], [address] Phone: [phone]   Fax:  [phone]  Physical Therapy Treatment  Patient Details  Name: Alison Lewis MRN: [id number] Date of Birth: 1936-03-22 Referring Provider (PT): Suella Broad, MD   Encounter Date: 09/05/2018  PT End of Session - 09/05/18 1541    Visit Number  14    Date for PT Re-Evaluation  09/12/18    Authorization Type  Medicare     Authorization Time Period  09/13/18 to 10/28/18    Authorization - Visit Number  14    Authorization - Number of Visits  19    PT Start Time  2458   Pt asked to stop due to fatigue    PT Stop Time  1610    PT Time Calculation (min)  33 min    Activity Tolerance  Patient tolerated treatment well;No increased pain    Behavior During Therapy  [facility] for tasks assessed/performed       History reviewed. No pertinent past medical history.  History reviewed. No pertinent surgical history.  There were no vitals filed for this visit.  Subjective Assessment - 09/05/18 1543    Subjective  I have been shopping!    Currently in Pain?  Yes    Pain Score  3     Pain Location  Hip    Pain Orientation  Left    Pain Descriptors / Indicators  Aching;Heaviness    Aggravating Factors   Too much walking    Pain Relieving Factors  Exercises probably    Multiple Pain Sites  No                       [facility] Adult PT Treatment/Exercise - 09/05/18 0001      Lumbar Exercises: Standing   Other Standing Lumbar Exercises  weightshifting and  marching on minitramp 1 min each      Knee/Hip Exercises: Stretches   Active Hamstring Stretch  Right;Left;3 reps;10 seconds      Knee/Hip Exercises: Aerobic   Nustep  L2 x 8 min   PTA present to discuss further appointments     Knee/Hip Exercises: Standing   Knee Flexion  Strengthening;Both;1 set;10 reps    Knee Flexion Limitations  3#    Hip Abduction  Stengthening;Both;2  sets;10 reps;Knee straight   3#     Knee/Hip Exercises: Seated   Long Arc Quad  Strengthening;Both;1 set;10 reps;Weights    Long Arc Quad Weight  3 lbs.      Knee/Hip Exercises: Supine   Bridges  --   2x5   Other Supine Knee/Hip Exercises  Hip flexion bil 3# 10x   VC for core contraction   Other Supine Knee/Hip Exercises  isometric hip extension 3 sec hold 10x bil                PT Short Term Goals - 09/01/18 1409      PT SHORT TERM GOAL #1   Title  Pt will demo consistency and independence with her initial HEP to decrease pain and improve LE strength.    Time  3    Period  Weeks    Status  Achieved        PT Long Term Goals - 09/01/18 1409      PT LONG TERM GOAL #1   Title  Pt will have increased hamstring flexibility to lacking no more than 30 deg with 90/90  testing, which will improve her standing posture throughout the day.    Baseline  45 deg lacking    Time  8    Period  Weeks    Status  On-going      PT LONG TERM GOAL #2   Title  Pt will demo improved BLE strength and power evident by her ability to complete 5x sit to stand in less than 14 sec without UE support or significant knee valgus deviation.     Baseline  15 sec    Time  8    Period  Weeks    Status  Partially Met      PT LONG TERM GOAL #3   Title  Pt will have atleast 4/5 MMT strenght of the LEs which will improve her efficiency with daily tasks.    Baseline  hip flexion 5/5 MMT    Time  8    Period  Weeks    Status  Partially Met      PT LONG TERM GOAL #4   Title  Pt will report being able to stand and complete light household activity for up to 15 minutes without the need for a rest break.     Baseline  anywhere from 10-15 min but not consistently    Time  8    Period  Weeks    Status  Partially Met      PT LONG TERM GOAL #5   Title  Pt will report being able to ascend and descend her steps at home, x1 trial, without increase in BLE pain.     Baseline  pt feels improved but it  fluctuates    Time  8    Period  Weeks    Status  On-going            Plan - 09/05/18 1542    Clinical Impression Statement  Pt used a pretty heavy resistance ( for her ) for most of her LE strengthening exercises today including work standing on the minitramp. treatment was limited dut to fatigue, pt requested we stop at just over 30 min. She shopped before coming to therapy and was planning on doing more this evening.      Rehab Potential  Good    PT Frequency  2x / week    PT Duration  6 weeks    PT Treatment/Interventions  ADLs/Self Care Home Management;Moist Heat;Iontophoresis 36m/ml Dexamethasone;Electrical Stimulation;Cryotherapy;Functional mobility training;Therapeutic activities;Therapeutic exercise;Patient/family education;Balance training;Neuromuscular re-education;Manual techniques;Passive range of motion;Dry needling    PT Next Visit Plan  hamstring stretch; hip abductor/extensor stretch, LE strength    PT Home Exercise Plan  [email or web address]    Consulted and Agree with Plan of Care  Patient       Patient will benefit from skilled therapeutic intervention in order to improve the following deficits and impairments:  Decreased activity tolerance, Decreased strength, Impaired flexibility, Postural dysfunction, Pain, Improper body mechanics, Decreased range of motion, Decreased balance, Difficulty walking, Increased muscle spasms, Hypomobility  Visit Diagnosis: Pain in right hip  Pain in left hip  Muscle weakness (generalized)  Chronic low back pain, unspecified back pain laterality, unspecified whether sciatica present     Problem List There are no active problems to display for this patient.   COCHRAN,JENNIFER, PTA 09/05/2018, 4:15 PM  [facility] [address] [place] [address]Phone: [phone]  Fax:  [phone] Name: Alison NORFOLKMRN: [id number] of Birth: 3August 22, 1937

## 2018-09-08 ENCOUNTER — Ambulatory Visit: Payer: Medicare Other | Admitting: Physical Therapy

## 2018-09-22 DIAGNOSIS — M7062 Trochanteric bursitis, left hip: Secondary | ICD-10-CM | POA: Diagnosis not present

## 2018-09-22 DIAGNOSIS — G894 Chronic pain syndrome: Secondary | ICD-10-CM | POA: Diagnosis not present

## 2018-09-22 DIAGNOSIS — M7061 Trochanteric bursitis, right hip: Secondary | ICD-10-CM | POA: Diagnosis not present

## 2018-09-23 ENCOUNTER — Encounter

## 2018-09-26 ENCOUNTER — Ambulatory Visit: Payer: Medicare Other | Attending: Physical Medicine and Rehabilitation | Admitting: Physical Therapy

## 2018-09-26 ENCOUNTER — Encounter: Payer: Self-pay | Admitting: Physical Therapy

## 2018-09-26 DIAGNOSIS — M545 Low back pain, unspecified: Secondary | ICD-10-CM

## 2018-09-26 DIAGNOSIS — G8929 Other chronic pain: Secondary | ICD-10-CM | POA: Insufficient documentation

## 2018-09-26 DIAGNOSIS — M6281 Muscle weakness (generalized): Secondary | ICD-10-CM

## 2018-09-26 DIAGNOSIS — M25551 Pain in right hip: Secondary | ICD-10-CM | POA: Diagnosis not present

## 2018-09-26 DIAGNOSIS — M25552 Pain in left hip: Secondary | ICD-10-CM

## 2018-09-26 NOTE — Therapy (Signed)
[facility] [address] [place], [place], [address] Phone: [phone]   Fax:  [phone]  Physical Therapy Treatment  Patient Details  Name: Alison Lewis MRN: [id number] Date of Birth: 13-Feb-1936 Referring Provider (PT): Suella Broad, MD   Encounter Date: 09/26/2018  PT End of Session - 09/26/18 1107    Visit Number  15    Date for PT Re-Evaluation  10/28/18    Authorization Type  Medicare     Authorization Time Period  09/13/18 to 10/28/18    Authorization - Visit Number  15   KX modifier?   Authorization - Number of Visits  19    PT Start Time  1102    PT Stop Time  1140    PT Time Calculation (min)  38 min    Activity Tolerance  No increased pain;Patient limited by fatigue    Behavior During Therapy  [facility] for tasks assessed/performed       History reviewed. No pertinent past medical history.  History reviewed. No pertinent surgical history.  There were no vitals filed for this visit.  Subjective Assessment - 09/26/18 1109    Subjective  I have been doing my stretching over break    Currently in Pain?  Yes    Pain Score  3     Pain Location  Hip    Pain Orientation  Right;Left    Pain Descriptors / Indicators  Dull;Aching    Aggravating Factors   over doing it    Pain Relieving Factors  stretching helps    Multiple Pain Sites  No                       [facility] Adult PT Treatment/Exercise - 09/26/18 0001      Lumbar Exercises: Stretches   Single Knee to Chest Stretch  Right;Left;2 reps;20 seconds    Piriformis Stretch  Right;Left;2 reps;20 seconds      Knee/Hip Exercises: Aerobic   Nustep  L2 x 8 min   PTA present to discuss further appointments     Knee/Hip Exercises: Standing   Knee Flexion  Strengthening;Both;2 sets;20 reps    Knee Flexion Limitations  3#    Hip Flexion  Stengthening;Both;2 sets;10 reps;Knee bent    Hip Flexion Limitations  3#    Hip Abduction   Stengthening;Both;2 sets;10 reps;Knee straight   3#     Knee/Hip Exercises: Seated   Long Arc Quad  Strengthening;Both;1 set;20 reps;Weights    Long Arc Quad Weight  4 lbs.      Knee/Hip Exercises: Supine   Bridges  --    more isometric glute today 10x 5 sec hold   Other Supine Knee/Hip Exercises  isometric hip extension 3 sec hold 10x bil                PT Short Term Goals - 09/01/18 1409      PT SHORT TERM GOAL #1   Title  Pt will demo consistency and independence with her initial HEP to decrease pain and improve LE strength.    Time  3    Period  Weeks    Status  Achieved        PT Long Term Goals - 09/01/18 1409      PT LONG TERM GOAL #1   Title  Pt will have increased hamstring flexibility to lacking no more than 30 deg with 90/90 testing, which will improve her standing posture  throughout the day.    Baseline  45 deg lacking    Time  8    Period  Weeks    Status  On-going      PT LONG TERM GOAL #2   Title  Pt will demo improved BLE strength and power evident by her ability to complete 5x sit to stand in less than 14 sec without UE support or significant knee valgus deviation.     Baseline  15 sec    Time  8    Period  Weeks    Status  Partially Met      PT LONG TERM GOAL #3   Title  Pt will have atleast 4/5 MMT strenght of the LEs which will improve her efficiency with daily tasks.    Baseline  hip flexion 5/5 MMT    Time  8    Period  Weeks    Status  Partially Met      PT LONG TERM GOAL #4   Title  Pt will report being able to stand and complete light household activity for up to 15 minutes without the need for a rest break.     Baseline  anywhere from 10-15 min but not consistently    Time  8    Period  Weeks    Status  Partially Met      PT LONG TERM GOAL #5   Title  Pt will report being able to ascend and descend her steps at home, x1 trial, without increase in BLE pain.     Baseline  pt feels improved but it fluctuates    Time  8     Period  Weeks    Status  On-going            Plan - 09/26/18 1108    Clinical Impression Statement  Pt has seen her MD and per pt report he is pleased with her progress and might consider some cortisone in 4 weeks for her hips. Resumed LE strength and hip flexibiity exercises as we have been off for the holiday break. Pt tolerated increased resistance with quads today and remains challenged with the current level of resisatnce for her hip strength. Visably shakey with modified bridge exercise.    Rehab Potential  Good    PT Frequency  2x / week    PT Duration  6 weeks    PT Treatment/Interventions  ADLs/Self Care Home Management;Moist Heat;Iontophoresis 84m/ml Dexamethasone;Electrical Stimulation;Cryotherapy;Functional mobility training;Therapeutic activities;Therapeutic exercise;Patient/family education;Balance training;Neuromuscular re-education;Manual techniques;Passive range of motion;Dry needling    PT Next Visit Plan  hamstring stretch; hip abductor/extensor stretch, LE strength    PT Home Exercise Plan  [email or web address]    Consulted and Agree with Plan of Care  Patient       Patient will benefit from skilled therapeutic intervention in order to improve the following deficits and impairments:  Decreased activity tolerance, Decreased strength, Impaired flexibility, Postural dysfunction, Pain, Improper body mechanics, Decreased range of motion, Decreased balance, Difficulty walking, Increased muscle spasms, Hypomobility  Visit Diagnosis: Pain in right hip  Pain in left hip  Muscle weakness (generalized)  Chronic low back pain, unspecified back pain laterality, unspecified whether sciatica present     Problem List There are no active problems to display for this patient.   COCHRAN,JENNIFER, PTA 09/26/2018, 11:42 AM  [facility] [address] [place] [address]Phone: [phone]  Fax:  [phone] Name:  Alison Eve  Lewis MRN: [id number] Date of Birth: 05-18-1936

## 2018-09-28 ENCOUNTER — Encounter: Payer: Self-pay | Admitting: Physical Therapy

## 2018-09-28 ENCOUNTER — Ambulatory Visit: Payer: Medicare Other | Admitting: Physical Therapy

## 2018-09-28 DIAGNOSIS — M6281 Muscle weakness (generalized): Secondary | ICD-10-CM | POA: Diagnosis not present

## 2018-09-28 DIAGNOSIS — M25551 Pain in right hip: Secondary | ICD-10-CM

## 2018-09-28 DIAGNOSIS — M25552 Pain in left hip: Secondary | ICD-10-CM | POA: Diagnosis not present

## 2018-09-28 DIAGNOSIS — G8929 Other chronic pain: Secondary | ICD-10-CM | POA: Diagnosis not present

## 2018-09-28 DIAGNOSIS — M545 Low back pain, unspecified: Secondary | ICD-10-CM

## 2018-09-28 NOTE — Therapy (Signed)
[facility] [address], [place], [place], [address] Phone: [phone]   Fax:  [phone]  Physical Therapy Treatment  Patient Details  Name: Alison Lewis MRN: [id number] Date of Birth: 14-Jul-1936 Referring Provider (PT): Suella Broad, MD   Encounter Date: 09/28/2018  PT End of Session - 09/28/18 1152    Visit Number  16    Date for PT Re-Evaluation  10/28/18    Authorization Type  Medicare     Authorization Time Period  09/13/18 to 10/28/18    Authorization - Visit Number  16    Authorization - Number of Visits  19    PT Start Time  8341    PT Stop Time  1230    PT Time Calculation (min)  43 min    Activity Tolerance  No increased pain;Patient limited by fatigue    Behavior During Therapy  [facility] for tasks assessed/performed       History reviewed. No pertinent past medical history.  History reviewed. No pertinent surgical history.  There were no vitals filed for this visit.  Subjective Assessment - 09/28/18 1153    Subjective  I wasn't as sore as I thought I would be.     Currently in Pain?  Yes    Pain Score  2     Pain Location  Hip    Pain Orientation  Right;Left    Pain Descriptors / Indicators  Dull    Multiple Pain Sites  No                       [facility] Adult PT Treatment/Exercise - 09/28/18 0001      Knee/Hip Exercises: Stretches   Active Hamstring Stretch  Both;2 reps;20 seconds      Knee/Hip Exercises: Aerobic   Nustep  L2 x 8 min   PTA present to monitor as this was at end of session     Knee/Hip Exercises: Standing   Knee Flexion  Strengthening;Both;2 sets;20 reps   weight still very appropriate, pt not as fatigued after    Knee Flexion Limitations  3#    Hip Flexion  Stengthening;Both;2 sets;10 reps;Knee bent    Hip Flexion Limitations  3#      Knee/Hip Exercises: Seated   Long Arc Quad  Strengthening;Both;2 sets;10 reps;Weights    Long Arc Quad Weight  4 lbs.     Sit to Sand  10 reps;without UE support   holding red plyoball     Manual Therapy   Soft tissue mobilization  Lateral hips , glutes, quads Addaday assited   performed in between exercises              PT Short Term Goals - 09/01/18 1409      PT SHORT TERM GOAL #1   Title  Pt will demo consistency and independence with her initial HEP to decrease pain and improve LE strength.    Time  3    Period  Weeks    Status  Achieved        PT Long Term Goals - 09/01/18 1409      PT LONG TERM GOAL #1   Title  Pt will have increased hamstring flexibility to lacking no more than 30 deg with 90/90 testing, which will improve her standing posture throughout the day.    Baseline  45 deg lacking    Time  8    Period  Weeks  Status  On-going      PT LONG TERM GOAL #2   Title  Pt will demo improved BLE strength and power evident by her ability to complete 5x sit to stand in less than 14 sec without UE support or significant knee valgus deviation.     Baseline  15 sec    Time  8    Period  Weeks    Status  Partially Met      PT LONG TERM GOAL #3   Title  Pt will have atleast 4/5 MMT strenght of the LEs which will improve her efficiency with daily tasks.    Baseline  hip flexion 5/5 MMT    Time  8    Period  Weeks    Status  Partially Met      PT LONG TERM GOAL #4   Title  Pt will report being able to stand and complete light household activity for up to 15 minutes without the need for a rest break.     Baseline  anywhere from 10-15 min but not consistently    Time  8    Period  Weeks    Status  Partially Met      PT LONG TERM GOAL #5   Title  Pt will report being able to ascend and descend her steps at home, x1 trial, without increase in BLE pain.     Baseline  pt feels improved but it fluctuates    Time  8    Period  Weeks    Status  On-going            Plan - 09/28/18 1223    Clinical Impression Statement  Pt has not gotten too sore this week from her  exercises( to her surprise). Pt performed some multi-task exercises today with using the Addaday in between exercises to either facilitate her hips or help keep them loose as she exercised. Pt liked this very much.  She was able to complete over 40 minutes of exercise without any complaints of discomfort, fatigue or fear of what her legs may feel like later.     Rehab Potential  Good    PT Frequency  2x / week    PT Duration  6 weeks    PT Treatment/Interventions  ADLs/Self Care Home Management;Moist Heat;Iontophoresis 24m/ml Dexamethasone;Electrical Stimulation;Cryotherapy;Functional mobility training;Therapeutic activities;Therapeutic exercise;Patient/family education;Balance training;Neuromuscular re-education;Manual techniques;Passive range of motion;Dry needling    PT Next Visit Plan  hamstring stretch; hip abductor/extensor stretch, LE strength    PT Home Exercise Plan  [email or web address]    Consulted and Agree with Plan of Care  Patient       Patient will benefit from skilled therapeutic intervention in order to improve the following deficits and impairments:  Decreased activity tolerance, Decreased strength, Impaired flexibility, Postural dysfunction, Pain, Improper body mechanics, Decreased range of motion, Decreased balance, Difficulty walking, Increased muscle spasms, Hypomobility  Visit Diagnosis: Pain in right hip  Pain in left hip  Muscle weakness (generalized)  Chronic low back pain, unspecified back pain laterality, unspecified whether sciatica present     Problem List There are no active problems to display for this patient.   COCHRAN,JENNIFER, PTA 09/28/2018, 2:52 PM  [facility] [address] [place] [address]Phone: [phone]  Fax:  [phone] Name: Alison FRUMKINMRN: [id number] of Birth: [birth year]

## 2018-10-04 ENCOUNTER — Encounter: Payer: Self-pay | Admitting: Physical Therapy

## 2018-10-04 ENCOUNTER — Ambulatory Visit: Payer: Medicare Other | Admitting: Physical Therapy

## 2018-10-04 DIAGNOSIS — G8929 Other chronic pain: Secondary | ICD-10-CM | POA: Diagnosis not present

## 2018-10-04 DIAGNOSIS — M25552 Pain in left hip: Secondary | ICD-10-CM

## 2018-10-04 DIAGNOSIS — M25551 Pain in right hip: Secondary | ICD-10-CM | POA: Diagnosis not present

## 2018-10-04 DIAGNOSIS — M545 Low back pain, unspecified: Secondary | ICD-10-CM

## 2018-10-04 DIAGNOSIS — M6281 Muscle weakness (generalized): Secondary | ICD-10-CM | POA: Diagnosis not present

## 2018-10-04 NOTE — Therapy (Signed)
[facility] [address] [place], [place], [address] Phone: [phone]   Fax:  [phone]  Physical Therapy Treatment  Patient Details  Name: Alison Lewis MRN: [id number] Date of Birth: 28-May-1936 Referring Provider (PT): Suella Broad, MD   Encounter Date: 10/04/2018  PT End of Session - 10/04/18 1149    Visit Number  17    Date for PT Re-Evaluation  10/28/18    Authorization Type  Medicare     Authorization Time Period  09/13/18 to 10/28/18    Authorization - Visit Number  17    Authorization - Number of Visits  19    PT Start Time  2353    PT Stop Time  1145    PT Time Calculation (min)  40 min    Activity Tolerance  No increased pain;Patient limited by fatigue    Behavior During Therapy  [facility] for tasks assessed/performed       History reviewed. No pertinent past medical history.  History reviewed. No pertinent surgical history.  There were no vitals filed for this visit.  Subjective Assessment - 10/04/18 1110    Subjective  Pt states things are going well. She has a little bit of soreness in her legs more so on the Rt.     Currently in Pain?  Yes    Pain Score  2     Pain Location  Hip    Pain Orientation  Right;Posterior;Lateral    Pain Descriptors / Indicators  Aching;Dull    Pain Type  Chronic pain    Pain Radiating Towards  none     Pain Onset  More than a month ago    Pain Frequency  Intermittent    Aggravating Factors   over doing it     Pain Relieving Factors  stretching helps                        [facility] Adult PT Treatment/Exercise - 10/04/18 0001      Lumbar Exercises: Standing   Other Standing Lumbar Exercises  --      Lumbar Exercises: Supine   Bridge  5 reps    Bridge Limitations  1 set with 5 sec hold, 1 set with 10 sec hold     Other Supine Lumbar Exercises  bridge with LE on 8" box 2x5 reps with tactile cues to decrease hip adduction      Knee/Hip Exercises:  Stretches   Press photographer  Both;1 rep;20 seconds    Gastroc Stretch Limitations  standing lunge       Knee/Hip Exercises: Standing   Hip Abduction  Stengthening;2 sets;Both;5 reps;Knee straight    Abduction Limitations  yellow TB around feet     Other Standing Knee Exercises  standing RNT hip abduction with LE on 2nd step/lunge using yellow TB pull medially x10 reps each LE      Knee/Hip Exercises: Seated   Other Seated Knee/Hip Exercises  Rt and Lt hip external rotation with yellow TB x10 reps       Manual Therapy   Soft tissue mobilization  STM Rt distal quad/hamstring    Myofascial Release  trigger point release Rt glute med             PT Education - 10/04/18 1148    Education Details  technique with therex; importance of increasing HEP adherence with strengthening therex to meet personal goals    Person(s) Educated  Patient    Methods  Explanation;Handout;Verbal cues    Comprehension  Verbalized understanding;Returned demonstration       PT Short Term Goals - 09/01/18 1409      PT SHORT TERM GOAL #1   Title  Pt will demo consistency and independence with her initial HEP to decrease pain and improve LE strength.    Time  3    Period  Weeks    Status  Achieved        PT Long Term Goals - 09/01/18 1409      PT LONG TERM GOAL #1   Title  Pt will have increased hamstring flexibility to lacking no more than 30 deg with 90/90 testing, which will improve her standing posture throughout the day.    Baseline  45 deg lacking    Time  8    Period  Weeks    Status  On-going      PT LONG TERM GOAL #2   Title  Pt will demo improved BLE strength and power evident by her ability to complete 5x sit to stand in less than 14 sec without UE support or significant knee valgus deviation.     Baseline  15 sec    Time  8    Period  Weeks    Status  Partially Met      PT LONG TERM GOAL #3   Title  Pt will have atleast 4/5 MMT strenght of the LEs which will improve her  efficiency with daily tasks.    Baseline  hip flexion 5/5 MMT    Time  8    Period  Weeks    Status  Partially Met      PT LONG TERM GOAL #4   Title  Pt will report being able to stand and complete light household activity for up to 15 minutes without the need for a rest break.     Baseline  anywhere from 10-15 min but not consistently    Time  8    Period  Weeks    Status  Partially Met      PT LONG TERM GOAL #5   Title  Pt will report being able to ascend and descend her steps at home, x1 trial, without increase in BLE pain.     Baseline  pt feels improved but it fluctuates    Time  8    Period  Weeks    Status  On-going            Plan - 10/04/18 1150    Clinical Impression Statement  Pt is mostly adherent with her flexibility program, however she is not completing her strengthening exercises much throughout the week. She does have increased stamina evident by her ability to complete all sets and reps requested of her during today's session. Focused primarily on promoting gluteal strength through various positions and levels of resistance. Pt does have trouble with maintaining isometric glute med activation, evident by her tendency for Trendelenburg deviation despite verbal and tactile cues provided. She reported no increase in pain end of today's session and had good understanding of HEP updates.     Rehab Potential  Good    PT Frequency  2x / week    PT Duration  6 weeks    PT Treatment/Interventions  ADLs/Self Care Home Management;Moist Heat;Iontophoresis 84m/ml Dexamethasone;Electrical Stimulation;Cryotherapy;Functional mobility training;Therapeutic activities;Therapeutic exercise;Patient/family education;Balance training;Neuromuscular re-education;Manual techniques;Passive range of motion;Dry needling    PT Next Visit Plan  hip  adductor flexibility; hip abductor/extensor strength in more challenging holds and positions.     PT Home Exercise Plan  [email or web address]    Consulted and  Agree with Plan of Care  Patient       Patient will benefit from skilled therapeutic intervention in order to improve the following deficits and impairments:  Decreased activity tolerance, Decreased strength, Impaired flexibility, Postural dysfunction, Pain, Improper body mechanics, Decreased range of motion, Decreased balance, Difficulty walking, Increased muscle spasms, Hypomobility  Visit Diagnosis: Pain in right hip  Pain in left hip  Muscle weakness (generalized)  Chronic low back pain, unspecified back pain laterality, unspecified whether sciatica present     Problem List There are no active problems to display for this patient.   12:10 PM,10/04/18 Sherol Dade PT, DPT [facility]  [facility] [address], [place], [place], [address] Phone: [phone]   Fax:  [phone]  Name: Alison Lewis MRN: [id number] Date of Birth: July 03, 1936

## 2018-10-04 NOTE — Patient Instructions (Signed)
Access Code: [email or web address]  URL: [email or web address]  Date: 10/04/2018  Prepared by: Sherol Dade   Exercises  Bent Knee Fallouts - 10 reps - 2 sets - 1x daily - 7x weekly  Supine Piriformis Stretch with Foot on Ground - 10 reps - 2 sets - 1x daily - 7x weekly  Supine Figure 4 Piriformis Stretch - 10 reps - 2 sets - 1x daily - 7x weekly  Supine Bridge - 10 reps - 2 sets - 1x daily - 7x weekly  Side Stepping with Resistance at Feet - 5 reps - 3 sets - 1x daily - 7x weekly    [facility] [address], [place], [place] [address] Phone # [phone] Fax [phone]

## 2018-10-06 ENCOUNTER — Encounter: Payer: Self-pay | Admitting: Physical Therapy

## 2018-10-06 ENCOUNTER — Ambulatory Visit: Payer: Medicare Other | Admitting: Physical Therapy

## 2018-10-06 DIAGNOSIS — M545 Low back pain, unspecified: Secondary | ICD-10-CM

## 2018-10-06 DIAGNOSIS — M25552 Pain in left hip: Secondary | ICD-10-CM | POA: Diagnosis not present

## 2018-10-06 DIAGNOSIS — M6281 Muscle weakness (generalized): Secondary | ICD-10-CM

## 2018-10-06 DIAGNOSIS — M25551 Pain in right hip: Secondary | ICD-10-CM | POA: Diagnosis not present

## 2018-10-06 DIAGNOSIS — G8929 Other chronic pain: Secondary | ICD-10-CM

## 2018-10-06 NOTE — Therapy (Signed)
[facility] [address] [place], [place], [address] Phone: [phone]   Fax:  [phone]  Physical Therapy Treatment  Patient Details  Name: MEILY GLOWACKI MRN: [id number] Date of Birth: 04/26/1936 Referring Provider (PT): Suella Broad, MD   Encounter Date: 10/06/2018  PT End of Session - 10/06/18 1144    Visit Number  18    Date for PT Re-Evaluation  10/28/18    Authorization Type  Medicare     Authorization Time Period  09/13/18 to 10/28/18    Authorization - Visit Number  18    Authorization - Number of Visits  19    PT Start Time  1100    PT Stop Time  1139    PT Time Calculation (min)  39 min    Activity Tolerance  No increased pain;Patient limited by fatigue    Behavior During Therapy  [facility] for tasks assessed/performed       History reviewed. No pertinent past medical history.  History reviewed. No pertinent surgical history.  There were no vitals filed for this visit.  Subjective Assessment - 10/06/18 1105    Subjective  Pt states that she was not too sore after her last session. She is impressed by this.    Currently in Pain?  No/denies    Pain Onset  More than a month ago         [facility] PT Assessment - 10/06/18 0001      Flexibility   Hamstrings  Rt: 40 deg lacking, 35 deg lacking                    [facility] Adult PT Treatment/Exercise - 10/06/18 0001      Lumbar Exercises: Aerobic   Nustep  L2 x4 min, PT present to discuss soreness following last session      Lumbar Exercises: Supine   Other Supine Lumbar Exercises  low trunk rotation 10x3 sec hold end range Lt and Rt       Knee/Hip Exercises: Stretches   Piriformis Stretch  Both;20 seconds;3 reps    Piriformis Stretch Limitations  figure 4, last set with pull knee towards chest (pt requiring encouragement to complete this)    Other Knee/Hip Stretches  standing hip adductor stretch 3x20 sec       Knee/Hip Exercises:  Standing   Heel Raises  Both;1 set;20 reps    Hip Abduction  Stengthening;1 set;10 reps;Knee straight    Abduction Limitations  isometric hold with gait belt around knees, x3 sec hold       Knee/Hip Exercises: Seated   Clamshell with TheraBand  Red   2x10 reps each LE      Knee/Hip Exercises: Supine   Bridges  Strengthening;Both;2 sets;5 reps    Bridges Limitations  10 sec hold     Other Supine Knee/Hip Exercises  hip abduction isometric 12x5 sec hold BLE      Manual Therapy   Manual therapy comments  Rt and Lt hamstring stretch 3x20 sec, contract/relax/stretch x2 reps each              PT Education - 10/06/18 1143    Education Details  technique with therex; benefits of HEP adherence    Person(s) Educated  Patient    Methods  Explanation;Verbal cues    Comprehension  Verbalized understanding;Returned demonstration       PT Short Term Goals - 10/06/18 1133      PT SHORT TERM GOAL #  1   Title  Pt will demo consistency and independence with her initial HEP to decrease pain and improve LE strength.    Time  3    Period  Weeks    Status  Partially Met        PT Long Term Goals - 10/06/18 1133      PT LONG TERM GOAL #1   Title  Pt will have increased hamstring flexibility to lacking no more than 30 deg with 90/90 testing, which will improve her standing posture throughout the day.    Baseline  45 deg lacking    Time  8    Period  Weeks    Status  On-going      PT LONG TERM GOAL #2   Title  Pt will demo improved BLE strength and power evident by her ability to complete 5x sit to stand in less than 14 sec without UE support or significant knee valgus deviation.     Baseline  15 sec    Time  8    Period  Weeks    Status  Partially Met      PT LONG TERM GOAL #3   Title  Pt will have atleast 4/5 MMT strenght of the LEs which will improve her efficiency with daily tasks.    Baseline  hip flexion 5/5 MMT    Time  8    Period  Weeks    Status  Partially Met       PT LONG TERM GOAL #4   Title  Pt will report being able to stand and complete light household activity for up to 15 minutes without the need for a rest break.     Baseline  anywhere from 10-15 min but not consistently    Time  8    Period  Weeks    Status  Partially Met      PT LONG TERM GOAL #5   Title  Pt will report being able to ascend and descend her steps at home, x1 trial, without increase in BLE pain.     Baseline  pt feels improved but it fluctuates    Time  8    Period  Weeks    Status  On-going            Plan - 10/06/18 1144    Clinical Impression Statement  Pt with minimal soreness following last session's exercises. Despite her minimal adherence with her HEP throughout the week, she does demonstrate improvements in hamstring flexibility by 5-10 deg compared to her most recent re-evaluation. Focused on therex to promote hip strength and flexibility in various positions in order to improve pt's participation. Pt was able to complete atleast 10 minutes of standing exercise without seated rest break. Therapist discussed importance of HEP adherence moving forward and pt verbalized understanding of this. She would continue to benefit from further encouragement with this.     Rehab Potential  Good    PT Frequency  2x / week    PT Duration  6 weeks    PT Treatment/Interventions  ADLs/Self Care Home Management;Moist Heat;Iontophoresis 5m/ml Dexamethasone;Electrical Stimulation;Cryotherapy;Functional mobility training;Therapeutic activities;Therapeutic exercise;Patient/family education;Balance training;Neuromuscular re-education;Manual techniques;Passive range of motion;Dry needling    PT Next Visit Plan  hip adductor flexibility; hip abductor/extensor strength in more challenging holds and positions. Pt was instructed to complete HEP atleast 1 time over the weekend    PT Home Exercise Plan  [id number]    Consulted and Agree with Plan  of Care  Patient       Patient will benefit  from skilled therapeutic intervention in order to improve the following deficits and impairments:  Decreased activity tolerance, Decreased strength, Impaired flexibility, Postural dysfunction, Pain, Improper body mechanics, Decreased range of motion, Decreased balance, Difficulty walking, Increased muscle spasms, Hypomobility  Visit Diagnosis: Pain in right hip  Pain in left hip  Muscle weakness (generalized)  Chronic low back pain, unspecified back pain laterality, unspecified whether sciatica present     Problem List There are no active problems to display for this patient.   11:47 AM,10/06/18 [place] [place] [facility] [place]  [facility] [address], [place], [place], [address] Phone: [phone]   Fax:  [phone]  Name: CONITA AMENTA MRN: [id number] Date of Birth: 1935/09/26

## 2018-10-10 ENCOUNTER — Encounter: Payer: Self-pay | Admitting: Physical Therapy

## 2018-10-10 ENCOUNTER — Ambulatory Visit: Payer: Medicare Other | Admitting: Physical Therapy

## 2018-10-10 DIAGNOSIS — M6281 Muscle weakness (generalized): Secondary | ICD-10-CM

## 2018-10-10 DIAGNOSIS — M545 Low back pain, unspecified: Secondary | ICD-10-CM

## 2018-10-10 DIAGNOSIS — G8929 Other chronic pain: Secondary | ICD-10-CM

## 2018-10-10 DIAGNOSIS — M25552 Pain in left hip: Secondary | ICD-10-CM | POA: Diagnosis not present

## 2018-10-10 DIAGNOSIS — M25551 Pain in right hip: Secondary | ICD-10-CM

## 2018-10-10 NOTE — Therapy (Signed)
[facility] [address] [place], [place], [address] Phone: [phone]   Fax:  [phone]  Physical Therapy Treatment  Patient Details  Name: Alison Lewis MRN: [id number] Date of Birth: [birth year] Referring Provider (PT): Suella Broad, MD   Encounter Date: 10/10/2018  PT End of Session - 10/10/18 1104    Visit Number  19    Date for PT Re-Evaluation  10/28/18    Authorization Type  Medicare     Authorization Time Period  09/13/18 to 10/28/18    PT Start Time  1103    PT Stop Time  1142    PT Time Calculation (min)  39 min    Activity Tolerance  Patient tolerated treatment well    Behavior During Therapy  [facility] for tasks assessed/performed       History reviewed. No pertinent past medical history.  History reviewed. No pertinent surgical history.  There were no vitals filed for this visit.  Subjective Assessment - 10/10/18 1106    Subjective  I see the MD in a week from this Friday. Pain has been about the same, it may have moved down towards the knees.     Currently in Pain?  Yes    Pain Score  3     Pain Location  --   Annoying thigh pain   Pain Orientation  Right;Left    Multiple Pain Sites  No                       [facility] Adult PT Treatment/Exercise - 10/10/18 0001      Knee/Hip Exercises: Stretches   Other Knee/Hip Stretches  Supine adductor stretch bil 30 sec with 1 pillow prop  2 sets      Knee/Hip Exercises: Standing   Heel Raises  Both;1 set;20 reps    Knee Flexion  --   3# bil 15x   Hip Flexion  --   3# bil 15x   Other Standing Knee Exercises  1 foot on slider, hip abd 10x bil. Needs balance poles to hold onto: light CGA      Knee/Hip Exercises: Seated   Long Arc Quad  Strengthening;Both;1 set;15 reps;Weights    Long Arc Quad Weight  3 lbs.    Clamshell with TheraBand  Green   2x10 reps each LE      Knee/Hip Exercises: Supine   Bridges  Strengthening;Both;3 sets;5  reps   5 sec holds   Other Supine Knee/Hip Exercises  Green band 15x clam shells               PT Short Term Goals - 10/06/18 1133      PT SHORT TERM GOAL #1   Title  Pt will demo consistency and independence with her initial HEP to decrease pain and improve LE strength.    Time  3    Period  Weeks    Status  Partially Met        PT Long Term Goals - 10/06/18 1133      PT LONG TERM GOAL #1   Title  Pt will have increased hamstring flexibility to lacking no more than 30 deg with 90/90 testing, which will improve her standing posture throughout the day.    Baseline  45 deg lacking    Time  8    Period  Weeks    Status  On-going      PT LONG TERM GOAL #2  Title  Pt will demo improved BLE strength and power evident by her ability to complete 5x sit to stand in less than 14 sec without UE support or significant knee valgus deviation.     Baseline  15 sec    Time  8    Period  Weeks    Status  Partially Met      PT LONG TERM GOAL #3   Title  Pt will have atleast 4/5 MMT strenght of the LEs which will improve her efficiency with daily tasks.    Baseline  hip flexion 5/5 MMT    Time  8    Period  Weeks    Status  Partially Met      PT LONG TERM GOAL #4   Title  Pt will report being able to stand and complete light household activity for up to 15 minutes without the need for a rest break.     Baseline  anywhere from 10-15 min but not consistently    Time  8    Period  Weeks    Status  Partially Met      PT LONG TERM GOAL #5   Title  Pt will report being able to ascend and descend her steps at home, x1 trial, without increase in BLE pain.     Baseline  pt feels improved but it fluctuates    Time  8    Period  Weeks    Status  On-going            Plan - 10/10/18 1105    Clinical Impression Statement  Pt arrives today with reports that her pain seems to be spreading distally, stopping around the knees. Despite feeling stronger and more flexible she is  concerned withher pain and the recent spreading nature of this pain. She follows up with her pain MD a week from this Friday. She continues to tolerate all her strengthening exercises without exacerbation of pain and did not need to sit down as often today.     Rehab Potential  Good    PT Frequency  2x / week    PT Duration  6 weeks    PT Treatment/Interventions  ADLs/Self Care Home Management;Moist Heat;Iontophoresis 94m/ml Dexamethasone;Electrical Stimulation;Cryotherapy;Functional mobility training;Therapeutic activities;Therapeutic exercise;Patient/family education;Balance training;Neuromuscular re-education;Manual techniques;Passive range of motion;Dry needling    PT Next Visit Plan  MMT in next 1-2 visits, TUG in next 1-3 visits. Continue with LE strengh and flexibility.     PT Home Exercise Plan  [id number]    Consulted and Agree with Plan of Care  Patient       Patient will benefit from skilled therapeutic intervention in order to improve the following deficits and impairments:  Decreased activity tolerance, Decreased strength, Impaired flexibility, Postural dysfunction, Pain, Improper body mechanics, Decreased range of motion, Decreased balance, Difficulty walking, Increased muscle spasms, Hypomobility  Visit Diagnosis: Pain in right hip  Pain in left hip  Muscle weakness (generalized)  Chronic low back pain, unspecified back pain laterality, unspecified whether sciatica present     Problem List There are no active problems to display for this patient.   COCHRAN,JENNIFER, PTA 10/10/2018, 3:52 PM  [facility] [address] [place] [address]: [phone]  Fax:  [phone] Name: Alison RASOMRN: [id number] of Birth: [birth year]

## 2018-10-13 ENCOUNTER — Encounter: Payer: Self-pay | Admitting: Physical Therapy

## 2018-10-13 ENCOUNTER — Ambulatory Visit: Payer: Medicare Other | Admitting: Physical Therapy

## 2018-10-13 DIAGNOSIS — M25552 Pain in left hip: Secondary | ICD-10-CM

## 2018-10-13 DIAGNOSIS — M6281 Muscle weakness (generalized): Secondary | ICD-10-CM

## 2018-10-13 DIAGNOSIS — M545 Low back pain, unspecified: Secondary | ICD-10-CM

## 2018-10-13 DIAGNOSIS — M25551 Pain in right hip: Secondary | ICD-10-CM | POA: Diagnosis not present

## 2018-10-13 DIAGNOSIS — G8929 Other chronic pain: Secondary | ICD-10-CM

## 2018-10-13 NOTE — Therapy (Signed)
[facility] [address], [place], [place], [address] Phone: [phone]   Fax:  [phone]  Physical Therapy Treatment  Patient Details  Name: Alison Lewis MRN: [id number] Date of Birth: [birth year] Referring Provider (PT): Suella Broad, MD   Encounter Date: 10/13/2018  PT End of Session - 10/13/18 1143    Visit Number  20    Date for PT Re-Evaluation  10/28/18    Authorization Type  Medicare     Authorization Time Period  09/13/18 to 10/28/18    Authorization - Visit Number  7    Authorization - Number of Visits  10    PT Start Time  1101    PT Stop Time  5102    PT Time Calculation (min)  42 min    Activity Tolerance  Patient tolerated treatment well;No increased pain    Behavior During Therapy  [facility] for tasks assessed/performed       History reviewed. No pertinent past medical history.  History reviewed. No pertinent surgical history.  There were no vitals filed for this visit.  Subjective Assessment - 10/13/18 1105    Subjective  Pt states that she was not really able to complete her strengthening program but did do a few stretches this morning.     Currently in Pain?  No/denies                       [facility] Adult PT Treatment/Exercise - 10/13/18 0001      Lumbar Exercises: Stretches   Piriformis Stretch  Left;Right;5 reps;10 seconds    Piriformis Stretch Limitations  seated       Lumbar Exercises: Standing   Other Standing Lumbar Exercises  repeated flexion in standing x10 reps (no change in LE symptoms)      Lumbar Exercises: Seated   Other Seated Lumbar Exercises  forward flexion with UE on green physioball x15 reps       Knee/Hip Exercises: Standing   Other Standing Knee Exercises  3 way hip slider x5 reps each direction on each LE, UE support       Knee/Hip Exercises: Supine   Bridges with Clamshell  Both;2 sets;10 reps   yellow TB around knees      Manual Therapy   Manual therapy comments  Lt and Rt rotation mobilization of lumbar spine x3 bouts, single knee to chest stretch x5 reps in sidelying    Soft tissue mobilization  STM Lt and Rt distal quad    Myofascial Release  trigger point release Rt and Lt distal quadriceps              PT Education - 10/13/18 1143    Education Details  importance of completing HEP    Person(s) Educated  Patient    Methods  Explanation    Comprehension  Verbalized understanding       PT Short Term Goals - 10/06/18 1133      PT SHORT TERM GOAL #1   Title  Pt will demo consistency and independence with her initial HEP to decrease pain and improve LE strength.    Time  3    Period  Weeks    Status  Partially Met        PT Long Term Goals - 10/06/18 1133      PT LONG TERM GOAL #1   Title  Pt will have increased hamstring flexibility to lacking no more than 30 deg with  90/90 testing, which will improve her standing posture throughout the day.    Baseline  45 deg lacking    Time  8    Period  Weeks    Status  On-going      PT LONG TERM GOAL #2   Title  Pt will demo improved BLE strength and power evident by her ability to complete 5x sit to stand in less than 14 sec without UE support or significant knee valgus deviation.     Baseline  15 sec    Time  8    Period  Weeks    Status  Partially Met      PT LONG TERM GOAL #3   Title  Pt will have atleast 4/5 MMT strenght of the LEs which will improve her efficiency with daily tasks.    Baseline  hip flexion 5/5 MMT    Time  8    Period  Weeks    Status  Partially Met      PT LONG TERM GOAL #4   Title  Pt will report being able to stand and complete light household activity for up to 15 minutes without the need for a rest break.     Baseline  anywhere from 10-15 min but not consistently    Time  8    Period  Weeks    Status  Partially Met      PT LONG TERM GOAL #5   Title  Pt will report being able to ascend and descend her steps at home, x1  trial, without increase in BLE pain.     Baseline  pt feels improved but it fluctuates    Time  8    Period  Weeks    Status  On-going            Plan - 10/13/18 1144    Clinical Impression Statement  Continued this session with therex to promote hip flexibility and strength. Also included manual treatment to improve lumbar spine flexibility. Pt did note increase in BLE distal thigh pain which was reproduced with palpation of the lateral quadriceps. Ended session with soft tissue mobilization techniques to decrease trigger points. Pt reported pain free ambulation following this. Will continue with current POC.     Rehab Potential  Good    PT Frequency  2x / week    PT Duration  6 weeks    PT Treatment/Interventions  ADLs/Self Care Home Management;Moist Heat;Iontophoresis 73m/ml Dexamethasone;Electrical Stimulation;Cryotherapy;Functional mobility training;Therapeutic activities;Therapeutic exercise;Patient/family education;Balance training;Neuromuscular re-education;Manual techniques;Passive range of motion;Dry needling    PT Next Visit Plan  TUG; f/u on quad pain; Continue with LE strengh and flexibility progressions.     PT Home Exercise Plan  [id number]    Consulted and Agree with Plan of Care  Patient       Patient will benefit from skilled therapeutic intervention in order to improve the following deficits and impairments:  Decreased activity tolerance, Decreased strength, Impaired flexibility, Postural dysfunction, Pain, Improper body mechanics, Decreased range of motion, Decreased balance, Difficulty walking, Increased muscle spasms, Hypomobility  Visit Diagnosis: Pain in right hip  Pain in left hip  Muscle weakness (generalized)  Chronic low back pain, unspecified back pain laterality, unspecified whether sciatica present     Problem List There are no active problems to display for this patient.   11:51 AM,10/13/18 [place], [facility] [address]  [address] [place], [place], [address] Phone: [phone]   Fax:  [phone]  Name: PAMELYN BANCROFT MRN: [id number] Date of Birth: 26-Aug-1936

## 2018-10-18 ENCOUNTER — Encounter: Payer: Medicare Other | Admitting: Physical Therapy

## 2018-10-20 ENCOUNTER — Ambulatory Visit: Payer: Medicare Other | Admitting: Physical Therapy

## 2018-10-20 ENCOUNTER — Encounter: Payer: Self-pay | Admitting: Physical Therapy

## 2018-10-20 DIAGNOSIS — M6281 Muscle weakness (generalized): Secondary | ICD-10-CM

## 2018-10-20 DIAGNOSIS — M545 Low back pain, unspecified: Secondary | ICD-10-CM

## 2018-10-20 DIAGNOSIS — M25551 Pain in right hip: Secondary | ICD-10-CM

## 2018-10-20 DIAGNOSIS — M25552 Pain in left hip: Secondary | ICD-10-CM

## 2018-10-20 DIAGNOSIS — G8929 Other chronic pain: Secondary | ICD-10-CM

## 2018-10-20 NOTE — Therapy (Signed)
[facility] [address] [place], [place], [address] Phone: [phone]   Fax:  [phone]  Physical Therapy Treatment  Patient Details  Name: Alison Lewis MRN: [id number] Date of Birth: 03-02-1936 Referring Provider (PT): Suella Broad, MD   Encounter Date: 10/20/2018  PT End of Session - 10/20/18 1142    Visit Number  21    Date for PT Re-Evaluation  10/28/18    Authorization Type  Medicare     Authorization Time Period  09/13/18 to 10/28/18    Authorization - Visit Number  8    Authorization - Number of Visits  10    PT Start Time  2505    PT Stop Time  1141    PT Time Calculation (min)  38 min    Activity Tolerance  Patient tolerated treatment well;No increased pain    Behavior During Therapy  [facility] for tasks assessed/performed       History reviewed. No pertinent past medical history.  History reviewed. No pertinent surgical history.  There were no vitals filed for this visit.  Subjective Assessment - 10/20/18 1104    Subjective  Pt states that things are going well. She has a little ache in her thighs currently, but she does think it helped alot. She is supposed to go back to her MD but this is in 2 weeks. She was not able to do her HEP much since her last visit.     Currently in Pain?  Yes    Pain Score  2     Pain Location  --   B thighs   Pain Orientation  Right;Left;Lateral    Pain Descriptors / Indicators  Aching;Sore    Pain Type  Chronic pain    Pain Radiating Towards  none     Pain Onset  More than a month ago    Pain Frequency  Intermittent    Aggravating Factors   over doing it    Pain Relieving Factors  stretching helps some          [facility] PT Assessment - 10/20/18 0001      Transfers   Five time sit to stand comments   19 sec, intermittent UE support       Standardized Balance Assessment   Standardized Balance Assessment  Timed Up and Go Test                    [facility] Adult PT Treatment/Exercise - 10/20/18 0001      Lumbar Exercises: Seated   Other Seated Lumbar Exercises  forward flexion with UE on green physioball x15 reps       Lumbar Exercises: Supine   Other Supine Lumbar Exercises  B knees to chest with green physioball x20 reps; low trunk rotation with LE on green physioball x15 reps (PT assistance)       Knee/Hip Exercises: Seated   Long Arc Quad  Both;Strengthening;2 sets;10 reps    Long Arc Quad Weight  4 lbs.      Knee/Hip Exercises: Supine   Straight Leg Raises  Both;2 sets;10 reps      Knee/Hip Exercises: Sidelying   Clams  yellow TB x10 rep each      Manual Therapy   Soft tissue mobilization  rolling stick Lt and Rt quadriceps              PT Education - 10/20/18 1142    Education Details  self-massage at  home    Person(s) Educated  Patient    Methods  Explanation;Verbal cues;Demonstration    Comprehension  Verbalized understanding       PT Short Term Goals - 10/06/18 1133      PT SHORT TERM GOAL #1   Title  Pt will demo consistency and independence with her initial HEP to decrease pain and improve LE strength.    Time  3    Period  Weeks    Status  Partially Met        PT Long Term Goals - 10/06/18 1133      PT LONG TERM GOAL #1   Title  Pt will have increased hamstring flexibility to lacking no more than 30 deg with 90/90 testing, which will improve her standing posture throughout the day.    Baseline  45 deg lacking    Time  8    Period  Weeks    Status  On-going      PT LONG TERM GOAL #2   Title  Pt will demo improved BLE strength and power evident by her ability to complete 5x sit to stand in less than 14 sec without UE support or significant knee valgus deviation.     Baseline  15 sec    Time  8    Period  Weeks    Status  Partially Met      PT LONG TERM GOAL #3   Title  Pt will have atleast 4/5 MMT strenght of the LEs which will improve her efficiency with  daily tasks.    Baseline  hip flexion 5/5 MMT    Time  8    Period  Weeks    Status  Partially Met      PT LONG TERM GOAL #4   Title  Pt will report being able to stand and complete light household activity for up to 15 minutes without the need for a rest break.     Baseline  anywhere from 10-15 min but not consistently    Time  8    Period  Weeks    Status  Partially Met      PT LONG TERM GOAL #5   Title  Pt will report being able to ascend and descend her steps at home, x1 trial, without increase in BLE pain.     Baseline  pt feels improved but it fluctuates    Time  8    Period  Weeks    Status  On-going            Plan - 10/20/18 1142    Clinical Impression Statement  Pt remains inconsistent with her HEP adherence despite therapist efforts to encourage and educate. Pt's 5x sit to stand time increased this session compared to previous sessions. Pt did complete all reps and sets requested of her this session, noting appropriate muscle fatigue with this. Pt felt that she was somewhat improved following manual treatment to the quadriceps last session and therapist demonstrated ways for her to replicate this at home. She verbalized understanding of this. Will plan to discharge pt in 2 visits and focus on building a manageable HEP moving forward.     Rehab Potential  Good    PT Frequency  2x / week    PT Duration  6 weeks    PT Treatment/Interventions  ADLs/Self Care Home Management;Moist Heat;Iontophoresis 80m/ml Dexamethasone;Electrical Stimulation;Cryotherapy;Functional mobility training;Therapeutic activities;Therapeutic exercise;Patient/family education;Balance training;Neuromuscular re-education;Manual techniques;Passive range of motion;Dry needling  PT Next Visit Plan  TUG; f/u on quad pain; Continue with LE strengh and flexibility progressions.     PT Home Exercise Plan  [id number]    Consulted and Agree with Plan of Care  Patient       Patient will benefit from skilled  therapeutic intervention in order to improve the following deficits and impairments:  Decreased activity tolerance, Decreased strength, Impaired flexibility, Postural dysfunction, Pain, Improper body mechanics, Decreased range of motion, Decreased balance, Difficulty walking, Increased muscle spasms, Hypomobility  Visit Diagnosis: Pain in right hip  Pain in left hip  Muscle weakness (generalized)  Chronic low back pain, unspecified back pain laterality, unspecified whether sciatica present     Problem List There are no active problems to display for this patient.    11:58 AM,10/20/18 Sherol Dade PT, [facility] [address] [place], [place], [address] Phone: [phone]   Fax:  [phone]  Name: Alison Lewis MRN: [id number] Date of Birth: 1936-08-27

## 2018-10-24 ENCOUNTER — Encounter: Payer: Self-pay | Admitting: Physical Therapy

## 2018-10-24 ENCOUNTER — Ambulatory Visit: Payer: Medicare Other | Attending: Physical Medicine and Rehabilitation | Admitting: Physical Therapy

## 2018-10-24 DIAGNOSIS — M545 Low back pain: Secondary | ICD-10-CM | POA: Insufficient documentation

## 2018-10-24 DIAGNOSIS — M6281 Muscle weakness (generalized): Secondary | ICD-10-CM | POA: Diagnosis not present

## 2018-10-24 DIAGNOSIS — G8929 Other chronic pain: Secondary | ICD-10-CM | POA: Insufficient documentation

## 2018-10-24 DIAGNOSIS — M25552 Pain in left hip: Secondary | ICD-10-CM

## 2018-10-24 DIAGNOSIS — M25551 Pain in right hip: Secondary | ICD-10-CM | POA: Diagnosis not present

## 2018-10-24 NOTE — Therapy (Addendum)
[facility] [address] [place], [place], [address] Phone: [phone]   Fax:  [phone]  Physical Therapy Treatment.Discharge   Patient Details  Name: Alison Lewis MRN: [id number] Date of Birth: 1936-03-28 Referring Provider (PT): Suella Broad, MD   Encounter Date: 10/24/2018  PT End of Session - 10/24/18 1402    Visit Number  22    Date for PT Re-Evaluation  10/28/18    Authorization Type  Medicare     Authorization Time Period  09/13/18 to 10/28/18    PT Start Time  1402    PT Stop Time  1443    PT Time Calculation (min)  41 min    Activity Tolerance  Patient tolerated treatment well;No increased pain    Behavior During Therapy  [facility] for tasks assessed/performed       History reviewed. No pertinent past medical history.  History reviewed. No pertinent surgical history.  There were no vitals filed for this visit.  Subjective Assessment - 10/24/18 1407    Subjective  Pt and husband purchased a TENS unit. She brought the unit with her and would like to be educated in how to use it.     Currently in Pain?  Yes    Pain Score  2     Pain Location  --   thighs   Pain Orientation  Right;Left    Pain Descriptors / Indicators  Aching    Multiple Pain Sites  No                       [facility] Adult PT Treatment/Exercise - 10/24/18 0001      Self-Care   Self-Care  Other Self-Care Comments    Other Self-Care Comments   TENS unit education for home use including precautions and indications, donning &doffing unit, how to change parameters       Electrical Stimulation   Electrical Stimulation Location  lumbar    Electrical Stimulation Goals  --   Educated on changing parameters            PT Education - 10/24/18 1429    Education Details  Home TENS unit education.     Person(s) Educated  Patient    Methods  Explanation;Demonstration    Comprehension  Verbalized understanding;Returned  demonstration       PT Short Term Goals - 10/06/18 1133      PT SHORT TERM GOAL #1   Title  Pt will demo consistency and independence with her initial HEP to decrease pain and improve LE strength.    Time  3    Period  Weeks    Status  Partially Met        PT Long Term Goals - 10/06/18 1133      PT LONG TERM GOAL #1   Title  Pt will have increased hamstring flexibility to lacking no more than 30 deg with 90/90 testing, which will improve her standing posture throughout the day.    Baseline  45 deg lacking    Time  8    Period  Weeks    Status  On-going      PT LONG TERM GOAL #2   Title  Pt will demo improved BLE strength and power evident by her ability to complete 5x sit to stand in less than 14 sec without UE support or significant knee valgus deviation.     Baseline  15 sec  Time  8    Period  Weeks    Status  Partially Met      PT LONG TERM GOAL #3   Title  Pt will have atleast 4/5 MMT strenght of the LEs which will improve her efficiency with daily tasks.    Baseline  hip flexion 5/5 MMT    Time  8    Period  Weeks    Status  Partially Met      PT LONG TERM GOAL #4   Title  Pt will report being able to stand and complete light household activity for up to 15 minutes without the need for a rest break.     Baseline  anywhere from 10-15 min but not consistently    Time  8    Period  Weeks    Status  Partially Met      PT LONG TERM GOAL #5   Title  Pt will report being able to ascend and descend her steps at home, x1 trial, without increase in BLE pain.     Baseline  pt feels improved but it fluctuates    Time  8    Period  Weeks    Status  On-going            Plan - 10/24/18 1403    Clinical Impression Statement  pt arrives to therapy today feeling low level aching in her quads, LT > RT. Pt purchased a TENS unit for home use and brought it with her to be educated on how to use it.     Rehab Potential  Good    PT Frequency  2x / week    PT Duration  6  weeks    PT Treatment/Interventions  ADLs/Self Care Home Management;Moist Heat;Iontophoresis 28m/ml Dexamethasone;Electrical Stimulation;Cryotherapy;Functional mobility training;Therapeutic activities;Therapeutic exercise;Patient/family education;Balance training;Neuromuscular re-education;Manual techniques;Passive range of motion;Dry needling    PT Next Visit Plan  DC next visit: TUG, MMT and FOTO    PT Home Exercise Plan  [id number]    Consulted and Agree with Plan of Care  Patient       Patient will benefit from skilled therapeutic intervention in order to improve the following deficits and impairments:  Decreased activity tolerance, Decreased strength, Impaired flexibility, Postural dysfunction, Pain, Improper body mechanics, Decreased range of motion, Decreased balance, Difficulty walking, Increased muscle spasms, Hypomobility  Visit Diagnosis: Pain in right hip  Pain in left hip  Muscle weakness (generalized)  Chronic low back pain, unspecified back pain laterality, unspecified whether sciatica present     Problem List There are no active problems to display for this patient.   Jawanza Zambito, PTA 10/24/2018, 2:46 PM   [facility] [address] [place] [address]Phone: [phone]  Fax:  [phone] Name: Alison LEAMANMRN: [id number] of Birth: [birth year] *addendum to resolve episode of care [facility] [place] [place] [facility] [place]its from Start of Care: 22  Current functional level related to goals / functional outcomes: See above for more details    Remaining deficits: See above for more details    Education / Equipment: See above for more details  Plan: Patient agrees to discharge.  Patient goals were partially met. Patient is being discharged due to not returning since the last visit.  ?????    Pt's progress was limited due to her poor HEP adherence.  Despite regular education/encouragement from the therapist, she was minimally adherent  with her program. Pt did not return since her last visit.  9:53 AM,11/08/18 [place], [place] [facility] [place]

## 2018-10-27 ENCOUNTER — Ambulatory Visit: Payer: Medicare Other | Admitting: Physical Therapy

## 2018-11-07 DIAGNOSIS — Z79891 Long term (current) use of opiate analgesic: Secondary | ICD-10-CM | POA: Diagnosis not present

## 2018-11-07 DIAGNOSIS — M545 Low back pain: Secondary | ICD-10-CM | POA: Diagnosis not present

## 2018-11-07 DIAGNOSIS — M5136 Other intervertebral disc degeneration, lumbar region: Secondary | ICD-10-CM | POA: Diagnosis not present

## 2019-06-29 DIAGNOSIS — Z23 Encounter for immunization: Secondary | ICD-10-CM | POA: Diagnosis not present

## 2019-06-29 DIAGNOSIS — R7301 Impaired fasting glucose: Secondary | ICD-10-CM | POA: Diagnosis not present

## 2019-06-29 DIAGNOSIS — N1831 Chronic kidney disease, stage 3a: Secondary | ICD-10-CM | POA: Diagnosis not present

## 2019-06-29 DIAGNOSIS — E559 Vitamin D deficiency, unspecified: Secondary | ICD-10-CM | POA: Diagnosis not present

## 2019-06-29 DIAGNOSIS — E7800 Pure hypercholesterolemia, unspecified: Secondary | ICD-10-CM | POA: Diagnosis not present

## 2019-07-06 DIAGNOSIS — R7301 Impaired fasting glucose: Secondary | ICD-10-CM | POA: Diagnosis not present

## 2019-07-06 DIAGNOSIS — J449 Chronic obstructive pulmonary disease, unspecified: Secondary | ICD-10-CM | POA: Diagnosis not present

## 2019-07-06 DIAGNOSIS — N183 Chronic kidney disease, stage 3 unspecified: Secondary | ICD-10-CM | POA: Diagnosis not present

## 2019-07-06 DIAGNOSIS — M4800 Spinal stenosis, site unspecified: Secondary | ICD-10-CM | POA: Diagnosis not present

## 2019-07-06 DIAGNOSIS — E559 Vitamin D deficiency, unspecified: Secondary | ICD-10-CM | POA: Diagnosis not present

## 2019-07-06 DIAGNOSIS — E7800 Pure hypercholesterolemia, unspecified: Secondary | ICD-10-CM | POA: Diagnosis not present

## 2019-07-06 DIAGNOSIS — R030 Elevated blood-pressure reading, without diagnosis of hypertension: Secondary | ICD-10-CM | POA: Diagnosis not present

## 2019-10-07 ENCOUNTER — Ambulatory Visit: Payer: Medicare Other | Attending: Internal Medicine

## 2019-10-07 DIAGNOSIS — Z23 Encounter for immunization: Secondary | ICD-10-CM | POA: Diagnosis not present

## 2019-10-07 NOTE — Progress Notes (Signed)
   Covid-19 Vaccination Clinic  Name:  Alison Lewis    MRN: [id number] DOB: 08/07/1936  10/07/2019  [name] was observed post Covid-19 immunization for 15 minutes without incidence. She was provided with Vaccine Information Sheet and instruction to access the V-Safe system.   [name] was instructed to call 911 with any severe reactions post vaccine: Marland Kitchen Difficulty breathing  . Swelling of your face and throat  . A fast heartbeat  . A bad rash all over your body  . Dizziness and weakness    Immunizations Administered    Name Date Dose VIS Date Route   Pfizer COVID-19 Vaccine 10/07/2019 12:27 PM 0.3 mL 09/01/2019 Intramuscular   Manufacturer: [place]   Lot: [id number]   [place]: [id number]

## 2019-10-27 ENCOUNTER — Ambulatory Visit: Payer: Medicare Other | Attending: Internal Medicine

## 2019-10-27 DIAGNOSIS — Z23 Encounter for immunization: Secondary | ICD-10-CM

## 2019-10-27 NOTE — Progress Notes (Signed)
   Covid-19 Vaccination Clinic  Name:  CHALA KAZEMI    MRN: [id number] DOB: 1936/06/16  10/27/2019  Ms. Cradle was observed post Covid-19 immunization for 15 minutes without incidence. She was provided with Vaccine Information Sheet and instruction to access the V-Safe system.   Ms. Goldfield was instructed to call 911 with any severe reactions post vaccine: Marland Kitchen Difficulty breathing  . Swelling of your face and throat  . A fast heartbeat  . A bad rash all over your body  . Dizziness and weakness    Immunizations Administered    Name Date Dose VIS Date Route   Pfizer COVID-19 Vaccine 10/27/2019  2:00 PM 0.3 mL 09/01/2019 Intramuscular   Manufacturer: [place]   Lot: [id number]   [place]: [id number]

## 2020-05-13 DIAGNOSIS — Z79891 Long term (current) use of opiate analgesic: Secondary | ICD-10-CM | POA: Diagnosis not present

## 2020-05-13 DIAGNOSIS — M5136 Other intervertebral disc degeneration, lumbar region: Secondary | ICD-10-CM | POA: Diagnosis not present

## 2020-05-18 DIAGNOSIS — M5136 Other intervertebral disc degeneration, lumbar region: Secondary | ICD-10-CM | POA: Diagnosis not present

## 2020-05-20 DIAGNOSIS — H43811 Vitreous degeneration, right eye: Secondary | ICD-10-CM | POA: Diagnosis not present

## 2020-05-20 DIAGNOSIS — Z961 Presence of intraocular lens: Secondary | ICD-10-CM | POA: Diagnosis not present

## 2020-05-20 DIAGNOSIS — H04123 Dry eye syndrome of bilateral lacrimal glands: Secondary | ICD-10-CM | POA: Diagnosis not present

## 2020-05-30 ENCOUNTER — Other Ambulatory Visit: Payer: Self-pay

## 2020-05-30 ENCOUNTER — Encounter: Payer: Self-pay | Admitting: Internal Medicine

## 2020-05-30 ENCOUNTER — Ambulatory Visit (INDEPENDENT_AMBULATORY_CARE_PROVIDER_SITE_OTHER): Payer: Medicare Other | Admitting: Internal Medicine

## 2020-05-30 VITALS — BP 128/82 | HR 93 | Temp 97.7°F | Ht 65.5 in | Wt 184.4 lb

## 2020-05-30 DIAGNOSIS — Z1231 Encounter for screening mammogram for malignant neoplasm of breast: Secondary | ICD-10-CM

## 2020-05-30 DIAGNOSIS — R739 Hyperglycemia, unspecified: Secondary | ICD-10-CM | POA: Insufficient documentation

## 2020-05-30 DIAGNOSIS — J449 Chronic obstructive pulmonary disease, unspecified: Secondary | ICD-10-CM | POA: Diagnosis not present

## 2020-05-30 DIAGNOSIS — N189 Chronic kidney disease, unspecified: Secondary | ICD-10-CM | POA: Insufficient documentation

## 2020-05-30 DIAGNOSIS — M8589 Other specified disorders of bone density and structure, multiple sites: Secondary | ICD-10-CM | POA: Diagnosis not present

## 2020-05-30 DIAGNOSIS — E7800 Pure hypercholesterolemia, unspecified: Secondary | ICD-10-CM

## 2020-05-30 DIAGNOSIS — E559 Vitamin D deficiency, unspecified: Secondary | ICD-10-CM | POA: Diagnosis not present

## 2020-05-30 DIAGNOSIS — M48061 Spinal stenosis, lumbar region without neurogenic claudication: Secondary | ICD-10-CM | POA: Insufficient documentation

## 2020-05-30 DIAGNOSIS — J320 Chronic maxillary sinusitis: Secondary | ICD-10-CM

## 2020-05-30 DIAGNOSIS — Z23 Encounter for immunization: Secondary | ICD-10-CM

## 2020-05-30 NOTE — Progress Notes (Signed)
Provider:  Rexene Edison. [name], D.O., C.M.D. Location:   [facility]   Place of Service:   clinic  Previous PCP: Gayland Curry, DO Patient Care Team: Gayland Curry, DO as PCP - General (Geriatric Medicine)  Extended Emergency Contact Information Primary Emergency Contact: Schwake,James Address: [place], [place] Phone: [phone] Relation: Other Secondary Emergency Contact: Ferman Hamming Relation: Other  Goals of Care: Advanced Directive information Advanced Directives 05/30/2020  Does Patient Have a Medical Advance Directive? Yes  Type of Paramedic of [place];Out of facility DNR (pink MOST or yellow form);Living will  Does patient want to make changes to medical advance directive? No - Patient declined  Copy of [place] in Chart? Yes - validated most recent copy scanned in chart (See row information)  Pre-existing out of facility DNR order (yellow form or pink MOST form) Pink MOST/Yellow Form most recent copy in chart - Physician notified to receive inpatient order   Chief Complaint  Patient presents with  . Establish Care    New patient to establish care     HPI: Patient is a 84 y.o. female seen today to establish with [facility].  Records have been requested from previous PCP at [facility] and just received today.  She rarely goes to the doctor since moving to [place]--has been here 30 yrs.  She's from [place] originally.  She earned a BS in [place].  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  Her husband is already a patient here.  Had gall bladder removed.  She had her first attack soon after her first child was born.  They were in the [employer].    She had a blood clot over 20 yrs ago.  Unclear why she had it then.  She was on short-term anticoagulation without problems since.  BP elevated.  Will need recheck at end.  Top tends to be in 595G, but diastolic higher than usual.  It improved to 128/82.   She had  shingles a really long time ago.  It was around her midsection.  It was not bad back then.  She does not want it again and is willing to get her shingrix.  Given influenza vaccine today.   Her two biggest problems are:  Spinal stenosis and/or bursitis in her hip.  She cannot walk a lot or stand very long.  Surgery was suggested 3 years ago. She didn't want to do it then, and does not want to do it now.  She is living with it.  She's tried various pain meds--has one she's not tried yet--is an opioid.  Takes naproxen for it.  Takes 4 a day (2 in am and 2 at hs).  Had Rx ones and she's now using the otc.  She reports having a cast iron stomach.  She does think it helps some.    The other big issue is her head filling up at night.  She does not know if it's allergies or sinuses.  sometimes nose runs, sometimes not.  She has not done much for that.  Would occasionally take zyrtec; however, last time she took it, it made her sleepy into the next day.  She's not sure if it was the D type.  There's no pattern to it.    Uses doxycycline for dental issues ongoing long-term.    Takes calcium.  Had osteopenia.  Was on fosamax at one  time.  She did not have problems from the medication, but it was stopped (? Duration of therapy or just quit taking it).    She's behind on her mammogram.  Has gone to the breast center.    Has had colon polyps on cscope.  It's been a long time since she had that.  Need to check in records.    She does smoke:  She's down to 3-4 cigarettes per day.  She first smoked in college.  She has not tried medications to help her quit.  Read fine print on a smoking cessation med at one time--she thought the side effects were scarier than smoking.  She only smokes outside.  Her husband is on oxygen for his lung disease.  Dry eyes.  She has reading glasses.  Had prior cataract surgery.    She hears well.    Her last visit with Dr. Drema [place] was in October of 2020.    Past Medical History:   Diagnosis Date  . Allergy   . Cholecystitis   . Colon polyps   . Hip bursitis   . Hypertension   . Lumbar spinal stenosis   . Osteopenia   . Shingles   . Sinusitis   . Tobacco abuse    Past Surgical History:  Procedure Laterality Date  . CATARACT EXTRACTION, BILATERAL    . CHOLECYSTECTOMY      Social History   Socioeconomic History  . Marital status: Married    Spouse name: Jeneen Rinks   . Number of children: 2  . Years of education: Degree   . Highest education level: Associate degree: academic program  Occupational History  . Not on file  Tobacco Use  . Smoking status: Current Every Day Smoker  . Smokeless tobacco: Never Used  . Tobacco comment: 4-5 a day/ patients states to many years for how many years did you smoke  Vaping Use  . Vaping Use: Never used  Substance and Sexual Activity  . Alcohol use: Yes    Comment: 10  . Drug use: Never  . Sexual activity: Not Currently  Other Topics Concern  . Not on file  Social History Narrative   Diet: Left Blank      Do you drink/ eat things with caffeine? Yes      Marital status:    M                           What year were you married ? 1962      Do you live in a house, apartment,assistred living, condo, trailer, etc.)? House      Is it one or more stories? Yes      How many persons live in your home ? 2      Do you have any pets in your home ?(please list)  No      Highest Level of education completed: BS Home      Current or past profession:  Pharmacist, hospital, Retail, Homemaker      Do you exercise?    No                          Type & how often       ADVANCED DIRECTIVES (Please bring copies)      Do you have a living will?  Yes      Do you have a DNR form?   Yes  If not, do you want to discuss one?       Do you have signed POA?HPOA forms?    Yes             If so, please bring to your appointment      FUNCTIONAL STATUS- To be completed by Spouse / child / Staff       Do you have difficulty  bathing or dressing yourself ? No      Do you have difficulty preparing food or eating ?  Yes      Do you have difficulty managing your mediation ?  No      Do you have difficulty managing your finances ? No      Do you have difficulty affording your medication ?  No      Social Determinants of Health   Financial Resource Strain:   . Difficulty of Paying Living Expenses: Not on file  Food Insecurity:   . Worried About Charity fundraiser in the Last Year: Not on file  . Ran Out of Food in the Last Year: Not on file  Transportation Needs:   . Lack of Transportation (Medical): Not on file  . Lack of Transportation (Non-Medical): Not on file  Physical Activity:   . Days of Exercise per Week: Not on file  . Minutes of Exercise per Session: Not on file  Stress:   . Feeling of Stress : Not on file  Social Connections:   . Frequency of Communication with Friends and Family: Not on file  . Frequency of Social Gatherings with Friends and Family: Not on file  . Attends Religious Services: Not on file  . Active Member of Clubs or Organizations: Not on file  . Attends Archivist Meetings: Not on file  . Marital Status: Not on file    reports that she has been smoking. She has never used smokeless tobacco. She reports current alcohol use. She reports that she does not use drugs.  Functional Status Survey:    History reviewed. No pertinent family history.  Health Maintenance  Topic Date Due  . URINE MICROALBUMIN  Never done  . TETANUS/TDAP  Never done  . DEXA SCAN  Never done  . PNA vac Low Risk Adult (1 of 2 - PCV13) Never done  . INFLUENZA VACCINE  Completed  . COVID-19 Vaccine  Completed    No Known Allergies  Outpatient Encounter Medications as of 05/30/2020  Medication Sig  . aspirin EC 81 MG tablet Take 81 mg by mouth daily.  Marland Kitchen CALCIUM PO Take by mouth daily.  Marland Kitchen DOXYCYCLINE PO Take 20 g by mouth daily. Take 1 tablet by mouth 2 times a day  . naproxen sodium  (ALEVE) 220 MG tablet Take 220 mg by mouth daily.  . NON FORMULARY Vitamin daily   No facility-administered encounter medications on file as of 05/30/2020.    Review of Systems  Constitutional: Negative for chills, fever and malaise/fatigue.  HENT: Positive for congestion and sinus pain. Negative for hearing loss and sore throat.   Eyes: Negative for blurred vision.  Respiratory: Negative for cough, shortness of breath and wheezing.   Cardiovascular: Negative for chest pain, palpitations, orthopnea, leg swelling and PND.  Gastrointestinal: Negative for abdominal pain, constipation and diarrhea.  Genitourinary: Negative for dysuria.  Musculoskeletal: Positive for back pain and joint pain. Negative for falls.  Skin: Negative for itching and rash.  Neurological: Negative for dizziness, loss of consciousness and weakness.  Endo/Heme/Allergies: Positive for environmental allergies. Bruises/bleeds easily.  Psychiatric/Behavioral: Negative for depression and memory loss. The patient is not nervous/anxious and does not have insomnia.     Vitals:   05/30/20 1333  BP: 128/82  Pulse: 93  Temp: 97.7 F (36.5 C)  TempSrc: Temporal  SpO2: 99%  Weight: 184 lb 6.4 oz (83.6 kg)  Height: 5' 5.5" (1.664 m)   Body mass index is 30.22 kg/m. Physical Exam Vitals reviewed.  Constitutional:      General: She is not in acute distress.    Appearance: Normal appearance. She is not ill-appearing or toxic-appearing.  HENT:     Head: Normocephalic and atraumatic.     Right Ear: Tympanic membrane, ear canal and external ear normal.     Left Ear: Tympanic membrane, ear canal and external ear normal.     Nose: Congestion present.     Mouth/Throat:     Comments: Postnasal drip when lies down Eyes:     Extraocular Movements: Extraocular movements intact.     Conjunctiva/sclera: Conjunctivae normal.     Pupils: Pupils are equal, round, and reactive to light.  Cardiovascular:     Rate and Rhythm: Normal  rate and regular rhythm.     Pulses: Normal pulses.     Heart sounds: Normal heart sounds.  Pulmonary:     Effort: Pulmonary effort is normal.     Breath sounds: Normal breath sounds. No wheezing, rhonchi or rales.  Abdominal:     General: Bowel sounds are normal.     Palpations: Abdomen is soft.     Tenderness: There is no abdominal tenderness.  Musculoskeletal:        General: Tenderness present. Normal range of motion.     Cervical back: Neck supple.     Right lower leg: No edema.     Left lower leg: No edema.     Comments: Right hip bursa and lower lumbar region  Lymphadenopathy:     Cervical: No cervical adenopathy.  Skin:    General: Skin is warm and dry.     Capillary Refill: Capillary refill takes less than 2 seconds.  Neurological:     General: No focal deficit present.     Mental Status: She is alert and oriented to person, place, and time.     Cranial Nerves: No cranial nerve deficit.     Motor: No weakness.     Gait: Gait normal.  Psychiatric:        Mood and Affect: Mood normal.        Behavior: Behavior normal.        Thought Content: Thought content normal.        Judgment: Judgment normal.     Labs reviewed:  Records received and were being abstracted--appears not yet in chart Basic Metabolic Panel: No results for input(s): NA, K, CL, CO2, GLUCOSE, BUN, CREATININE, CALCIUM, MG, PHOS in the last 8760 hours. Liver Function Tests: No results for input(s): AST, ALT, ALKPHOS, BILITOT, PROT, ALBUMIN in the last 8760 hours. No results for input(s): LIPASE, AMYLASE in the last 8760 hours. No results for input(s): AMMONIA in the last 8760 hours. CBC: No results for input(s): WBC, NEUTROABS, HGB, HCT, MCV, PLT in the last 8760 hours. Cardiac Enzymes: No results for input(s): CKTOTAL, CKMB, CKMBINDEX, TROPONINI in the last 8760 hours. BNP: Invalid input(s): POCBNP No results found for: HGBA1C No results found for: TSH No results found for: VITAMINB12 No results  found for: FOLATE No results  found for: IRON, TIBC, FERRITIN  Imaging and Procedures noted on new patient packet: notes from Dr. Nelva Bush reviewed from 05/13/20 when she had her epidural and he prescribed ultram for her lumbar DDD  Assessment/Plan 1. Need for influenza vaccination - Flu Vaccine QUAD High Dose(Fluad) given today  2. Vitamin D deficiency - cont D3 supplement, encourage exercise as tolerated -update  DG Bone Density; Future when gets mammogram which is also overdue  3. Chronic obstructive pulmonary disease, unspecified COPD type ([place]) -not on treatment, but smoking long-term -likely this contributes to her nasal and throat congestion along with allergies, sinusitis/postnasal drip, and possibly some gerd from long-term nsaid use  4. Hyperglycemia - will f/u labs to see where she stands here - COMPLETE METABOLIC PANEL WITH GFR; Future - Hemoglobin A1c; Future  5. Chronic kidney disease, unspecified CKD stage -counseled about aleve use long-term and pontential effects on renal function and causing gastritis/gerd/ulcers -she is willing to attempt to use tylenol instead and topicals like voltaren gel - COMPLETE METABOLIC PANEL WITH GFR; Future  6. Spinal stenosis of lumbar region, unspecified whether neurogenic claudication present -s/p epidural, had lumbar DDD also noted in Dr Nelva Bush' notes  7. Hyperlipidemia ([place]) - f/u labs on this, NOT on mediation at this time - CBC with Differential/Platelet; Future - COMPLETE METABOLIC PANEL WITH GFR; Future - Lipid panel; Future  8. Chronic maxillary sinusitis -suggested use of flonase or nasonex nasal spray before resorting to an antihistamine due to the sedation she had historically when she tried zyrtec  9. Encounter for screening mammogram for malignant neoplasm of breast - update MM DIGITAL SCREENING BILATERAL; Future  10. Osteopenia of multiple sites - DG Bone Density; Future needs updating  Labs/tests ordered:   Lab  Orders     CBC with Differential/Platelet     COMPLETE METABOLIC PANEL WITH GFR     Hemoglobin A1c     Lipid panel  F/u in 6 mos for routine visit, but come in 2 weeks for fasting labs (was going away for a week b/w and not fasting today as PM appt)  Donavon Kimrey L. Kentavious Michele, D.O. [facility] [address], [place] [address] Cell Phone (Mon-Fri 8am-5pm):  [phone] On Call:  [phone] & follow prompts after 5pm & weekends Office Phone:  [phone] Office Fax:  [phone]

## 2020-05-30 NOTE — Patient Instructions (Addendum)
I recommend you get your shingrix series at the pharmacy.    I suggest trying tylenol up to 3000mg  per day.  Tylenol 500mg  try two tablets twice a day.  See how that does.  Try flonase spray or nasonex spray on a daily basis for your sinus and allergy congestion.    We'll get your mammogram and bone density ordered.

## 2020-05-31 DIAGNOSIS — Z23 Encounter for immunization: Secondary | ICD-10-CM

## 2020-05-31 DIAGNOSIS — M48061 Spinal stenosis, lumbar region without neurogenic claudication: Secondary | ICD-10-CM | POA: Diagnosis not present

## 2020-05-31 DIAGNOSIS — E559 Vitamin D deficiency, unspecified: Secondary | ICD-10-CM | POA: Diagnosis not present

## 2020-05-31 DIAGNOSIS — R739 Hyperglycemia, unspecified: Secondary | ICD-10-CM | POA: Diagnosis not present

## 2020-05-31 DIAGNOSIS — N189 Chronic kidney disease, unspecified: Secondary | ICD-10-CM | POA: Diagnosis not present

## 2020-05-31 DIAGNOSIS — Z1231 Encounter for screening mammogram for malignant neoplasm of breast: Secondary | ICD-10-CM | POA: Diagnosis not present

## 2020-05-31 DIAGNOSIS — M8589 Other specified disorders of bone density and structure, multiple sites: Secondary | ICD-10-CM | POA: Diagnosis not present

## 2020-05-31 DIAGNOSIS — J320 Chronic maxillary sinusitis: Secondary | ICD-10-CM | POA: Diagnosis not present

## 2020-05-31 DIAGNOSIS — J449 Chronic obstructive pulmonary disease, unspecified: Secondary | ICD-10-CM | POA: Diagnosis not present

## 2020-05-31 DIAGNOSIS — E7800 Pure hypercholesterolemia, unspecified: Secondary | ICD-10-CM | POA: Diagnosis not present

## 2020-06-04 ENCOUNTER — Encounter: Payer: Self-pay | Admitting: Internal Medicine

## 2020-06-13 ENCOUNTER — Other Ambulatory Visit: Payer: Self-pay

## 2020-06-13 ENCOUNTER — Other Ambulatory Visit: Payer: Medicare Other

## 2020-06-13 DIAGNOSIS — E7800 Pure hypercholesterolemia, unspecified: Secondary | ICD-10-CM

## 2020-06-13 DIAGNOSIS — R739 Hyperglycemia, unspecified: Secondary | ICD-10-CM

## 2020-06-13 DIAGNOSIS — N189 Chronic kidney disease, unspecified: Secondary | ICD-10-CM

## 2020-06-14 LAB — CBC WITH DIFFERENTIAL/PLATELET
Absolute Monocytes: 614 cells/uL (ref 200–950)
Basophils Absolute: 83 cells/uL (ref 0–200)
Basophils Relative: 1.2 %
Eosinophils Absolute: 228 cells/uL (ref 15–500)
Eosinophils Relative: 3.3 %
HCT: 49.1 % — ABNORMAL HIGH (ref 35.0–45.0)
Hemoglobin: 15.9 g/dL — ABNORMAL HIGH (ref 11.7–15.5)
Lymphs Abs: 2277 cells/uL (ref 850–3900)
MCH: 31.7 pg (ref 27.0–33.0)
MCHC: 32.4 g/dL (ref 32.0–36.0)
MCV: 98 fL (ref 80.0–100.0)
MPV: 10.6 fL (ref 7.5–12.5)
Monocytes Relative: 8.9 %
Neutro Abs: 3698 cells/uL (ref 1500–7800)
Neutrophils Relative %: 53.6 %
Platelets: 304 10*3/uL (ref 140–400)
RBC: 5.01 10*6/uL (ref 3.80–5.10)
RDW: 12.9 % (ref 11.0–15.0)
Total Lymphocyte: 33 %
WBC: 6.9 10*3/uL (ref 3.8–10.8)

## 2020-06-14 LAB — HEMOGLOBIN A1C
Hgb A1c MFr Bld: 6.2 % of total Hgb — ABNORMAL HIGH (ref <5.7)
Mean Plasma Glucose: 131 (calc)
eAG (mmol/L): 7.3 (calc)

## 2020-06-14 LAB — COMPLETE METABOLIC PANEL WITH GFR
AG Ratio: 1.6 (calc) (ref 1.0–2.5)
ALT: 18 U/L (ref 6–29)
AST: 19 U/L (ref 10–35)
Albumin: 4.2 g/dL (ref 3.6–5.1)
Alkaline phosphatase (APISO): 63 U/L (ref 37–153)
BUN/Creatinine Ratio: 18 (calc) (ref 6–22)
BUN: 16 mg/dL (ref 7–25)
CO2: 32 mmol/L (ref 20–32)
Calcium: 9.6 mg/dL (ref 8.6–10.4)
Chloride: 102 mmol/L (ref 98–110)
Creat: 0.9 mg/dL — ABNORMAL HIGH (ref 0.60–0.88)
GFR, Est African American: 69 mL/min/{1.73_m2} (ref >=60)
GFR, Est Non African American: 60 mL/min/{1.73_m2} (ref >=60)
Globulin: 2.6 g/dL (calc) (ref 1.9–3.7)
Glucose, Bld: 120 mg/dL — ABNORMAL HIGH (ref 65–99)
Potassium: 4.9 mmol/L (ref 3.5–5.3)
Sodium: 142 mmol/L (ref 135–146)
Total Bilirubin: 0.8 mg/dL (ref 0.2–1.2)
Total Protein: 6.8 g/dL (ref 6.1–8.1)

## 2020-06-14 LAB — LIPID PANEL
Cholesterol: 215 mg/dL — ABNORMAL HIGH (ref <200)
HDL: 81 mg/dL (ref >=50)
LDL Cholesterol (Calc): 113 mg/dL (calc) — ABNORMAL HIGH
Non-HDL Cholesterol (Calc): 134 mg/dL (calc) — ABNORMAL HIGH (ref <130)
Total CHOL/HDL Ratio: 2.7 (calc) (ref <5.0)
Triglycerides: 106 mg/dL (ref <150)

## 2020-06-14 NOTE — Progress Notes (Signed)
Bad cholesterol is above goal.  This can be improved with decreased fatty foods, sweets, fried items in the diet and increasing aerobic exercise like walking, swimming or cycling.   Sugar average is in prediabetic range so same recommendations as above. Liver, kidneys and electrolytes look ok. Blood counts are elevated (polycythemia) due to smoking

## 2020-06-15 DIAGNOSIS — Z23 Encounter for immunization: Secondary | ICD-10-CM | POA: Diagnosis not present

## 2020-07-01 ENCOUNTER — Other Ambulatory Visit: Payer: Self-pay

## 2020-07-01 ENCOUNTER — Ambulatory Visit: Payer: Medicare Other | Attending: Physical Medicine and Rehabilitation

## 2020-07-01 DIAGNOSIS — R252 Cramp and spasm: Secondary | ICD-10-CM | POA: Diagnosis not present

## 2020-07-01 DIAGNOSIS — M25551 Pain in right hip: Secondary | ICD-10-CM | POA: Insufficient documentation

## 2020-07-01 DIAGNOSIS — G8929 Other chronic pain: Secondary | ICD-10-CM | POA: Insufficient documentation

## 2020-07-01 DIAGNOSIS — M6281 Muscle weakness (generalized): Secondary | ICD-10-CM | POA: Diagnosis not present

## 2020-07-01 DIAGNOSIS — M25552 Pain in left hip: Secondary | ICD-10-CM | POA: Diagnosis not present

## 2020-07-01 DIAGNOSIS — R262 Difficulty in walking, not elsewhere classified: Secondary | ICD-10-CM | POA: Diagnosis not present

## 2020-07-01 DIAGNOSIS — M545 Low back pain, unspecified: Secondary | ICD-10-CM

## 2020-07-01 NOTE — Therapy (Signed)
[facility] [address] [place], [place], [address] Phone: [phone]   Fax:  [phone]  Physical Therapy Evaluation  Patient Details  Name: Alison Lewis MRN: [id number] Date of Birth: 03-01-1936 Referring Provider (PT): Suella Broad, MD   Encounter Date: 07/01/2020   PT End of Session - 07/01/20 1315    Visit Number 1    Date for PT Re-Evaluation 08/26/20    Authorization Type Medicare    Progress Note Due on Visit 10    PT Start Time 1232    PT Stop Time 1313    PT Time Calculation (min) 41 min    Activity Tolerance Patient tolerated treatment well    Behavior During Therapy [facility] for tasks assessed/performed           Past Medical History:  Diagnosis Date  . Allergy   . Cholecystitis   . Colon polyps   . Hip bursitis   . Hypertension   . Lumbar spinal stenosis   . Osteopenia   . Shingles   . Sinusitis   . Tobacco abuse     Past Surgical History:  Procedure Laterality Date  . CATARACT EXTRACTION, BILATERAL    . CHOLECYSTECTOMY      There were no vitals filed for this visit.    Subjective Assessment - 07/01/20 1235    Subjective Pt presents to PT with spinal stenosis due to chronic lumbar DDD.Pt denies any LBP, only lateral leg pain.  This is a chronic issue and has had spinal injections in August.  Pt is a candidate for surgery and pt does not want to have surgery.    Pertinent History lumbar spinal stenosis    Limitations Standing;Walking    How long can you stand comfortably? 5 minutes    How long can you walk comfortably? 5 minutes    Diagnostic tests none recent.  Old MRI- pt reports spinal stenosis.    Patient Stated Goals reduce bil leg pain, stand and walk longer    Currently in Pain? Yes    Pain Score 0-No pain   up to 5-6/10   Pain Location Leg    Pain Orientation Right;Left;Lateral    Pain Descriptors / Indicators Aching    Pain Type Chronic pain    Pain Onset More  than a month ago    Pain Frequency Intermittent    Aggravating Factors  standing, walking    Pain Relieving Factors Naproxen, sitting down              [facility] PT Assessment - 07/01/20 0001      Assessment   Medical Diagnosis Degeneration of intervertebral disc    Referring Provider (PT) Suella Broad, MD    Onset Date/Surgical Date 07/01/17   approximate   Next MD Visit none     Prior Therapy at this clinic ending 2/3/202      Precautions   Precautions None      Restrictions   Weight Bearing Restrictions No      Balance Screen   Has the patient fallen in the past 6 months No    Has the patient had a decrease in activity level because of a fear of falling?  No    Is the patient reluctant to leave their home because of a fear of falling?  No      Home Ecologist residence    Living Arrangements Spouse/significant other    Home Access  Stairs to enter    [place] to live on main level with bedroom/bathroom    Home Research officer, trade union - 2 wheels      Prior Function   Level of [place] Retired    Leisure needlpoint, reading      Cognition   Overall Cognitive Status Within Functional Limits for tasks assessed      Observation/Other Assessments   Focus on Therapeutic Outcomes (FOTO)  52% limitation      Posture/Postural Control   Posture/Postural Control Postural limitations    Postural Limitations Forward head;Rounded Shoulders;Increased thoracic kyphosis;Flexed trunk      ROM / Strength   AROM / PROM / Strength PROM;AROM;Strength      AROM   Overall AROM  Deficits    Overall AROM Comments bil hip flexibility limited by 25-50% bil without pain.  lumbar A/ROM is limited by 25% without pain      PROM   Overall PROM  Deficits    Overall PROM Comments see above for hip mobility       Strength   Overall Strength Deficits    Overall Strength Comments bil hips 4/5, knees 4+/5, ankle DF 4-/5        Palpation   Palpation comment palpable tenderness over lateral quads, trochanteric bursa bilaterally and lateral thigh at ITB      Transfers   Transfers Stand to Sit;Sit to Stand    Sit to Stand With upper extremity assist    Five time sit to stand comments  23.69 with hands    Stand to Sit With upper extremity assist      Ambulation/Gait   Ambulation/Gait Yes    Gait Pattern Antalgic;Lateral hip instability                      Objective measurements completed on examination: See above findings.               PT Education - 07/01/20 1310    Education Details Access Code: [email or web address]    Person(s) Educated Patient    Methods Explanation;Demonstration;Handout    Comprehension Verbalized understanding;Returned demonstration            PT Short Term Goals - 07/01/20 1242      PT SHORT TERM GOAL #1   Title Pt will demo consistency and independence with her initial HEP to decrease pain and improve LE strength.    Time 4    Period Weeks    Status New    Target Date 07/29/20      PT SHORT TERM GOAL #2   Title report a 25% reduction in bil LE pain with standing    Time 4    Period Weeks    Target Date 07/29/20      PT SHORT TERM GOAL #3   Title perform 5x sit to stand in < or = to 19 seconds (with hands) to reduce falls risk    Time 4    Period Weeks    Status New    Target Date 07/29/20             PT Long Term Goals - 07/01/20 1252      PT LONG TERM GOAL #1   Title be independent in advanced HEP    Baseline --    Time 8    Period Weeks    Status New    Target Date 08/26/20  PT LONG TERM GOAL #2   Title verbalize and demonstrate body mechanics modifications for lumbar protection with ADLs and self-care at home    Baseline --    Time 8    Period Weeks    Status New    Target Date 08/26/20      PT LONG TERM GOAL #3   Title report a 50% reduction in bil LE pain with standing for ADLs and self-care    Baseline --    Time 8     Period Weeks    Target Date 08/26/20      PT LONG TERM GOAL #4   Title Pt will report being able to stand and complete light household activity for up to 7-10 minutes without the need for a rest break.    Baseline --    Time 8    Period Weeks    Status New    Target Date 08/26/20      PT LONG TERM GOAL #5   Title perform 5x sit to stand in < or = to 15 seconds without UE support to reduce falls risk    Baseline --    Time 8    Period Weeks    Status New    Target Date 08/26/20                  Plan - 07/01/20 1319    Clinical Impression Statement Pt presents to PT with bil lateral hip pain of a chronic nature.  Pt has had injections into lumbar spine due to stenosis and bil trochanteric bursas.  Pt is limited to standing and walking 5 minutes due to LE pain that she rates as 5-6/10 and describes as aching.  Pt reports that this time limit is baseline over the past few years due to the chronic nature of her stenosis.  Pt has been caring for her husband over the past year after he fell so has not been consistent with her HEP issued in 10/2018 due to this.  Pt with reduced LE hip flexibility and strength with lateral hip instability and antalgia with gait on level surface.  Pt performed 5x sit to stand with UE support in 23.69 seconds indicating a falls risk. Pt with palpable tenderness over lateral quads, ITB and trochanteric bursa.  Pt will benefit from skilled to address LE functional strength, manual to address pain and flexibility/body mechanics to address stenosis.    Personal Factors and Comorbidities Age;Comorbidity 1    Comorbidities stenosis, cares for husband    Examination-Activity Limitations Stand;Stairs;Locomotion Level;Transfers    Examination-Participation Restrictions Church;Cleaning;Community Activity;Laundry;Shop;Meal Prep    Stability/Clinical Decision Making Evolving/Moderate complexity    Clinical Decision Making Moderate    Rehab Potential Good    PT  Frequency 2x / week    PT Duration 8 weeks    PT Treatment/Interventions ADLs/Self Care Home Management;Cryotherapy;Electrical Stimulation;Moist Heat;Gait training;Stair training;Functional mobility training;Therapeutic activities;Therapeutic exercise;Balance training;Neuromuscular re-education;Manual techniques;Passive range of motion;Dry needling;Taping    PT Next Visit Plan dry needling to bil quads and ITB if treated by PT, work on functional LE strength, flexion based exercise, balance exercises, core strength    PT Home Exercise Plan Access Code: [email or web address]    Consulted and Agree with Plan of Care Patient           Patient will benefit from skilled therapeutic intervention in order to improve the following deficits and impairments:  Abnormal gait, Decreased activity tolerance, Decreased balance, Decreased strength, Postural dysfunction, Improper  body mechanics, Impaired flexibility, Pain, Increased muscle spasms, Decreased endurance, Difficulty walking, Decreased range of motion  Visit Diagnosis: Muscle weakness (generalized) - Plan: PT plan of care cert/re-cert  Chronic low back pain, unspecified back pain laterality, unspecified whether sciatica present - Plan: PT plan of care cert/re-cert  Cramp and spasm - Plan: PT plan of care cert/re-cert  Difficulty in walking, not elsewhere classified - Plan: PT plan of care cert/re-cert     Problem List Patient Active Problem List   Diagnosis Date Noted  . Vitamin D deficiency 05/30/2020  . COPD (chronic obstructive pulmonary disease) ([place]) 05/30/2020  . Hyperglycemia 05/30/2020  . CKD (chronic kidney disease) 05/30/2020  . Lumbar spinal stenosis 05/30/2020   Sigurd Sos, PT 07/01/20 1:26 PM  [facility] [address], [place], [place], [address] Phone: [phone]   Fax:  [phone]  Name: KAORI JUMPER MRN: [id number] Date of Birth: 02-Aug-1936

## 2020-07-01 NOTE — Patient Instructions (Signed)
Access Code: [email or web address] URL: [email or web address] Date: 07/01/2020 Prepared by: Claiborne Billings  Exercises Hooklying Single Knee to Chest Stretch - 2 x daily - 7 x weekly - 1 sets - 3 reps - 20 hold Seated Hamstring Stretch - 3 x daily - 7 x weekly - 1 sets - 3 reps - 20 hold Seated Long Arc Quad - 3 x daily - 7 x weekly - 1 sets - 10 reps - 5 hold

## 2020-07-08 ENCOUNTER — Other Ambulatory Visit: Payer: Self-pay

## 2020-07-08 ENCOUNTER — Ambulatory Visit: Payer: Medicare Other | Admitting: Physical Therapy

## 2020-07-08 ENCOUNTER — Encounter: Payer: Self-pay | Admitting: Physical Therapy

## 2020-07-08 DIAGNOSIS — R262 Difficulty in walking, not elsewhere classified: Secondary | ICD-10-CM

## 2020-07-08 DIAGNOSIS — M6281 Muscle weakness (generalized): Secondary | ICD-10-CM | POA: Diagnosis not present

## 2020-07-08 DIAGNOSIS — R252 Cramp and spasm: Secondary | ICD-10-CM

## 2020-07-08 DIAGNOSIS — M545 Low back pain, unspecified: Secondary | ICD-10-CM | POA: Diagnosis not present

## 2020-07-08 DIAGNOSIS — G8929 Other chronic pain: Secondary | ICD-10-CM | POA: Diagnosis not present

## 2020-07-08 DIAGNOSIS — M25551 Pain in right hip: Secondary | ICD-10-CM | POA: Diagnosis not present

## 2020-07-08 DIAGNOSIS — M25552 Pain in left hip: Secondary | ICD-10-CM

## 2020-07-08 NOTE — Therapy (Signed)
[facility] [address], [place], [place], [address] Phone: [phone]   Fax:  [phone]  Physical Therapy Treatment  Patient Details  Name: Alison Lewis MRN: [id number] Date of Birth: 05-Jan-1936 Referring Provider (PT): Suella Broad, MD   Encounter Date: 07/08/2020   PT End of Session - 07/08/20 1240    Visit Number 2    Date for PT Re-Evaluation 08/26/20    Authorization Type Medicare    Progress Note Due on Visit 10    PT Start Time 1236    PT Stop Time 1308    PT Time Calculation (min) 32 min    Activity Tolerance Patient tolerated treatment well    Behavior During Therapy [facility] for tasks assessed/performed           Past Medical History:  Diagnosis Date  . Allergy   . Cholecystitis   . Colon polyps   . Hip bursitis   . Hypertension   . Lumbar spinal stenosis   . Osteopenia   . Shingles   . Sinusitis   . Tobacco abuse     Past Surgical History:  Procedure Laterality Date  . CATARACT EXTRACTION, BILATERAL    . CHOLECYSTECTOMY      There were no vitals filed for this visit.   Subjective Assessment - 07/08/20 1243    Subjective Doing as well as I think I can expect.    Pertinent History lumbar spinal stenosis    Currently in Pain? Yes    Pain Score 4     Pain Location --   anterior thighs Bil   Pain Orientation Right;Left    Pain Descriptors / Indicators Sore    Aggravating Factors  standing, walking    Pain Relieving Factors meds, sitting down    Multiple Pain Sites No                             [facility] Adult PT Treatment/Exercise - 07/08/20 0001      Lumbar Exercises: Stretches   Active Hamstring Stretch Right;Left;3 reps;20 seconds    Single Knee to Chest Stretch Right;Left;2 reps;20 seconds      Knee/Hip Exercises: Aerobic   Nustep L1 x 5 min      Knee/Hip Exercises: Seated   Long Arc Quad AROM;Strengthening;Both;1 set;10 reps    Ball Squeeze 10x     Clamshell with TheraBand --   green loop 10x2     Knee/Hip Exercises: Supine   Short Arc Quad Sets --    6x bil                   PT Short Term Goals - 07/01/20 1242      PT SHORT TERM GOAL #1   Title Pt will demo consistency and independence with her initial HEP to decrease pain and improve LE strength.    Time 4    Period Weeks    Status New    Target Date 07/29/20      PT SHORT TERM GOAL #2   Title report a 25% reduction in bil LE pain with standing    Time 4    Period Weeks    Target Date 07/29/20      PT SHORT TERM GOAL #3   Title perform 5x sit to stand in < or = to 19 seconds (with hands) to reduce falls risk    Time 4  Period Weeks    Status New    Target Date 07/29/20             PT Long Term Goals - 07/01/20 1252      PT LONG TERM GOAL #1   Title be independent in advanced HEP    Baseline --    Time 8    Period Weeks    Status New    Target Date 08/26/20      PT LONG TERM GOAL #2   Title verbalize and demonstrate body mechanics modifications for lumbar protection with ADLs and self-care at home    Baseline --    Time 8    Period Weeks    Status New    Target Date 08/26/20      PT LONG TERM GOAL #3   Title report a 50% reduction in bil LE pain with standing for ADLs and self-care    Baseline --    Time 8    Period Weeks    Target Date 08/26/20      PT LONG TERM GOAL #4   Title Pt will report being able to stand and complete light household activity for up to 7-10 minutes without the need for a rest break.    Baseline --    Time 8    Period Weeks    Status New    Target Date 08/26/20      PT LONG TERM GOAL #5   Title perform 5x sit to stand in < or = to 15 seconds without UE support to reduce falls risk    Baseline --    Time 8    Period Weeks    Status New    Target Date 08/26/20                 Plan - 07/08/20 1240    Clinical Impression Statement Pt reports doing "a little HEP" everyday. Pt reports have pain  in her quads when she stands or walks. No pain when she sits. Pt had a difficult time breathing in supine even with head elevated a bit. Pt really liked about 5 min of the addaday on each quad. Pt verbally committed to trying her HEP 2x day.    Personal Factors and Comorbidities Age;Comorbidity 1    Comorbidities stenosis, cares for husband    Examination-Activity Limitations Stand;Stairs;Locomotion Level;Transfers    Examination-Participation Restrictions Church;Cleaning;Community Activity;Laundry;Shop;Meal Prep    Stability/Clinical Decision Making Evolving/Moderate complexity    Rehab Potential Good    PT Frequency 2x / week    PT Duration 8 weeks    PT Treatment/Interventions ADLs/Self Care Home Management;Cryotherapy;Electrical Stimulation;Moist Heat;Gait training;Stair training;Functional mobility training;Therapeutic activities;Therapeutic exercise;Balance training;Neuromuscular re-education;Manual techniques;Passive range of motion;Dry needling;Taping    PT Next Visit Plan dry needling to bil quads and ITB if treated by PT, work on functional LE strength, flexion based exercise, balance exercises, core strength    PT Home Exercise Plan Access Code: [email or web address]    Consulted and Agree with Plan of Care Patient           Patient will benefit from skilled therapeutic intervention in order to improve the following deficits and impairments:  Abnormal gait, Decreased activity tolerance, Decreased balance, Decreased strength, Postural dysfunction, Improper body mechanics, Impaired flexibility, Pain, Increased muscle spasms, Decreased endurance, Difficulty walking, Decreased range of motion  Visit Diagnosis: Muscle weakness (generalized)  Chronic low back pain, unspecified back pain laterality, unspecified whether sciatica present  Cramp and spasm  Difficulty in walking, not elsewhere classified  Pain in right hip  Pain in left hip     Problem List Patient Active Problem List    Diagnosis Date Noted  . Vitamin D deficiency 05/30/2020  . COPD (chronic obstructive pulmonary disease) ([place]) 05/30/2020  . Hyperglycemia 05/30/2020  . CKD (chronic kidney disease) 05/30/2020  . Lumbar spinal stenosis 05/30/2020    Jatinder Mcdonagh, PTA 07/08/2020, 1:09 PM  [facility] [address], [place], [place], [address] Phone: [phone]   Fax:  [phone]  Name: KEMONIE CUTILLO MRN: [id number] Date of Birth: Nov 23, 1935

## 2020-07-15 ENCOUNTER — Ambulatory Visit: Payer: Medicare Other | Admitting: Physical Therapy

## 2020-07-15 ENCOUNTER — Encounter: Payer: Self-pay | Admitting: Physical Therapy

## 2020-07-15 ENCOUNTER — Other Ambulatory Visit: Payer: Self-pay

## 2020-07-15 DIAGNOSIS — R262 Difficulty in walking, not elsewhere classified: Secondary | ICD-10-CM

## 2020-07-15 DIAGNOSIS — M6281 Muscle weakness (generalized): Secondary | ICD-10-CM

## 2020-07-15 DIAGNOSIS — G8929 Other chronic pain: Secondary | ICD-10-CM

## 2020-07-15 DIAGNOSIS — M545 Low back pain, unspecified: Secondary | ICD-10-CM

## 2020-07-15 DIAGNOSIS — R252 Cramp and spasm: Secondary | ICD-10-CM | POA: Diagnosis not present

## 2020-07-15 DIAGNOSIS — M25551 Pain in right hip: Secondary | ICD-10-CM | POA: Diagnosis not present

## 2020-07-15 NOTE — Therapy (Signed)
[facility] [address] [place], [place], [address] Phone: [phone]   Fax:  [phone]  Physical Therapy Treatment  Patient Details  Name: Alison Lewis MRN: [id number] Date of Birth: [birth year] Referring Provider (PT): Suella Broad, MD   Encounter Date: 07/15/2020   PT End of Session - 07/15/20 1235    Visit Number 3    Date for PT Re-Evaluation 08/26/20    Authorization Type Medicare    Progress Note Due on Visit 10    PT Start Time 1232    PT Stop Time 1310    PT Time Calculation (min) 38 min    Activity Tolerance Patient tolerated treatment well    Behavior During Therapy [facility] for tasks assessed/performed           Past Medical History:  Diagnosis Date  . Allergy   . Cholecystitis   . Colon polyps   . Hip bursitis   . Hypertension   . Lumbar spinal stenosis   . Osteopenia   . Shingles   . Sinusitis   . Tobacco abuse     Past Surgical History:  Procedure Laterality Date  . CATARACT EXTRACTION, BILATERAL    . CHOLECYSTECTOMY      There were no vitals filed for this visit.   Subjective Assessment - 07/15/20 1237    Subjective I am just ok today, feels like a Monday.    Pertinent History lumbar spinal stenosis    Currently in Pain? Yes    Pain Score 3     Pain Location Leg    Pain Orientation Right;Left   quads/lateral leg   Pain Descriptors / Indicators Dull    Aggravating Factors  standing and walking    Pain Relieving Factors sitting                             [facility] Adult PT Treatment/Exercise - 07/15/20 0001      Knee/Hip Exercises: Aerobic   Nustep L1 5 min      Knee/Hip Exercises: Standing   Heel Raises Both;1 set;15 reps    Forward Step Up Both;1 set;5 reps;Hand Hold: 2;Step Height: 6"      Knee/Hip Exercises: Seated   Long Arc Quad Strengthening;Both;1 set;10 reps;Weights    Long Arc Quad Weight 2 lbs.    Ball Squeeze 15x    Clamshell with  TheraBand --   green loop 10x2   Marching AROM;Both;1 set;20 reps    Sit to Sand 1 set;5 reps;with UE support      Manual Therapy   Manual Therapy Soft tissue mobilization    Soft tissue mobilization Addaday assist bil quads and lateral quads 5 min each                    PT Short Term Goals - 07/01/20 1242      PT SHORT TERM GOAL #1   Title Pt will demo consistency and independence with her initial HEP to decrease pain and improve LE strength.    Time 4    Period Weeks    Status New    Target Date 07/29/20      PT SHORT TERM GOAL #2   Title report a 25% reduction in bil LE pain with standing    Time 4    Period Weeks    Target Date 07/29/20      PT SHORT TERM GOAL #3  Title perform 5x sit to stand in < or = to 19 seconds (with hands) to reduce falls risk    Time 4    Period Weeks    Status New    Target Date 07/29/20             PT Long Term Goals - 07/01/20 1252      PT LONG TERM GOAL #1   Title be independent in advanced HEP    Baseline --    Time 8    Period Weeks    Status New    Target Date 08/26/20      PT LONG TERM GOAL #2   Title verbalize and demonstrate body mechanics modifications for lumbar protection with ADLs and self-care at home    Baseline --    Time 8    Period Weeks    Status New    Target Date 08/26/20      PT LONG TERM GOAL #3   Title report a 50% reduction in bil LE pain with standing for ADLs and self-care    Baseline --    Time 8    Period Weeks    Target Date 08/26/20      PT LONG TERM GOAL #4   Title Pt will report being able to stand and complete light household activity for up to 7-10 minutes without the need for a rest break.    Baseline --    Time 8    Period Weeks    Status New    Target Date 08/26/20      PT LONG TERM GOAL #5   Title perform 5x sit to stand in < or = to 15 seconds without UE support to reduce falls risk    Baseline --    Time 8    Period Weeks    Status New    Target Date 08/26/20                  Plan - 07/15/20 1235    Clinical Impression Statement Pt reports she is 'trying' to be more compliant with her HEP. Pt agreed to add standing heel rasies to HEP. pt could tolerate 2# weights for LAQ. Introduced small amount of weightbearing exercise which pt did show fatigue in the RTLE more than LT. No increased pain, pt likes the Addaday to her quads, feels like she "walks better."    Personal Factors and Comorbidities Age;Comorbidity 1    Comorbidities stenosis, cares for husband    Examination-Activity Limitations Stand;Stairs;Locomotion Level;Transfers    Examination-Participation Restrictions Church;Cleaning;Community Activity;Laundry;Shop;Meal Prep    Stability/Clinical Decision Making Evolving/Moderate complexity    Rehab Potential Good    PT Frequency 2x / week    PT Duration 8 weeks    PT Treatment/Interventions ADLs/Self Care Home Management;Cryotherapy;Electrical Stimulation;Moist Heat;Gait training;Stair training;Functional mobility training;Therapeutic activities;Therapeutic exercise;Balance training;Neuromuscular re-education;Manual techniques;Passive range of motion;Dry needling;Taping    PT Next Visit Plan dry needling to bil quads and ITB if treated by PT, work on functional LE strength, flexion based exercise, balance exercises, core strength    PT Home Exercise Plan Access Code: [email or web address]    Consulted and Agree with Plan of Care Patient           Patient will benefit from skilled therapeutic intervention in order to improve the following deficits and impairments:  Abnormal gait, Decreased activity tolerance, Decreased balance, Decreased strength, Postural dysfunction, Improper body mechanics, Impaired flexibility, Pain, Increased muscle spasms, Decreased endurance, Difficulty walking,  Decreased range of motion  Visit Diagnosis: Muscle weakness (generalized)  Chronic low back pain, unspecified back pain laterality, unspecified whether sciatica  present  Cramp and spasm  Difficulty in walking, not elsewhere classified     Problem List Patient Active Problem List   Diagnosis Date Noted  . Vitamin D deficiency 05/30/2020  . COPD (chronic obstructive pulmonary disease) ([place]) 05/30/2020  . Hyperglycemia 05/30/2020  . CKD (chronic kidney disease) 05/30/2020  . Lumbar spinal stenosis 05/30/2020    Alison Lewis, PTA 07/15/2020, 1:10 PM  [facility] [address] [place], [place], [address] Phone: [phone]   Fax:  [phone]  Name: Alison CREMEANS MRN: [id number] Date of Birth: 1936-09-18  Access Code: [email or web address]: [email or web address]: 10/25/2021Prepared by: Anderson Malta CochranExercises  Hooklying Single Knee to Chest Stretch - 2 x daily - 7 x weekly - 1 sets - 3 reps - 20 hold  Seated Hamstring Stretch - 3 x daily - 7 x weekly - 1 sets - 3 reps - 20 hold  Seated Long Arc Quad - 3 x daily - 7 x weekly - 1 sets - 10 reps - 5 hold  Heel rises with counter support - 2 x daily - 7 x weekly - 1 sets - 10 reps

## 2020-07-22 ENCOUNTER — Other Ambulatory Visit: Payer: Self-pay

## 2020-07-22 ENCOUNTER — Ambulatory Visit: Payer: Medicare Other | Attending: Physical Medicine and Rehabilitation

## 2020-07-22 DIAGNOSIS — M25552 Pain in left hip: Secondary | ICD-10-CM | POA: Diagnosis not present

## 2020-07-22 DIAGNOSIS — M25551 Pain in right hip: Secondary | ICD-10-CM | POA: Diagnosis not present

## 2020-07-22 DIAGNOSIS — R252 Cramp and spasm: Secondary | ICD-10-CM | POA: Diagnosis not present

## 2020-07-22 DIAGNOSIS — R262 Difficulty in walking, not elsewhere classified: Secondary | ICD-10-CM | POA: Diagnosis not present

## 2020-07-22 DIAGNOSIS — M545 Low back pain, unspecified: Secondary | ICD-10-CM | POA: Diagnosis not present

## 2020-07-22 DIAGNOSIS — G8929 Other chronic pain: Secondary | ICD-10-CM | POA: Diagnosis not present

## 2020-07-22 DIAGNOSIS — M6281 Muscle weakness (generalized): Secondary | ICD-10-CM | POA: Diagnosis not present

## 2020-07-22 NOTE — Therapy (Signed)
[facility] [address] [place], [place], [address] Phone: [phone]   Fax:  [phone]  Physical Therapy Treatment  Patient Details  Name: Alison Lewis MRN: [id number] Date of Birth: 06-16-1936 Referring Provider (PT): Suella Broad, MD   Encounter Date: 07/22/2020   PT End of Session - 07/22/20 1319    Visit Number 4    Date for PT Re-Evaluation 08/26/20    Authorization Type Medicare    Progress Note Due on Visit 10    PT Start Time 3545    PT Stop Time 1315    PT Time Calculation (min) 40 min    Activity Tolerance Patient tolerated treatment well    Behavior During Therapy [facility] for tasks assessed/performed           Past Medical History:  Diagnosis Date   Allergy    Cholecystitis    Colon polyps    Hip bursitis    Hypertension    Lumbar spinal stenosis    Osteopenia    Shingles    Sinusitis    Tobacco abuse     Past Surgical History:  Procedure Laterality Date   CATARACT EXTRACTION, BILATERAL     CHOLECYSTECTOMY      There were no vitals filed for this visit.   Subjective Assessment - 07/22/20 1240    Subjective I am doing so so with my exercises.    Currently in Pain? Yes    Pain Score 2     Pain Location Leg    Pain Orientation Left;Right    Pain Descriptors / Indicators Dull    Pain Type Chronic pain    Pain Onset More than a month ago    Pain Frequency Intermittent    Aggravating Factors  standing and walking    Pain Relieving Factors sitting                             [facility] Adult PT Treatment/Exercise - 07/22/20 0001      Knee/Hip Exercises: Aerobic   Nustep L1 5 min   PT present to discuss progress      Knee/Hip Exercises: Standing   Heel Raises Both;1 set;15 reps    Forward Step Up Both;1 set;5 reps;Hand Hold: 2;Step Height: 6"      Knee/Hip Exercises: Seated   Long Arc Quad Strengthening;Both;1 set;10 reps;Weights    Long Arc  Quad Weight 2 lbs.    Ball Squeeze 15x    Clamshell with TheraBand --   green loop 10x2   Marching AROM;Both;1 set;20 reps    Marching Limitations 2# added      Manual Therapy   Manual Therapy Soft tissue mobilization    Soft tissue mobilization Addaday assist bil quads and lateral quads 5 min each                    PT Short Term Goals - 07/01/20 1242      PT SHORT TERM GOAL #1   Title Pt will demo consistency and independence with her initial HEP to decrease pain and improve LE strength.    Time 4    Period Weeks    Status New    Target Date 07/29/20      PT SHORT TERM GOAL #2   Title report a 25% reduction in bil LE pain with standing    Time 4    Period Weeks  Target Date 07/29/20      PT SHORT TERM GOAL #3   Title perform 5x sit to stand in < or = to 19 seconds (with hands) to reduce falls risk    Time 4    Period Weeks    Status New    Target Date 07/29/20             PT Long Term Goals - 07/01/20 1252      PT LONG TERM GOAL #1   Title be independent in advanced HEP    Baseline --    Time 8    Period Weeks    Status New    Target Date 08/26/20      PT LONG TERM GOAL #2   Title verbalize and demonstrate body mechanics modifications for lumbar protection with ADLs and self-care at home    Baseline --    Time 8    Period Weeks    Status New    Target Date 08/26/20      PT LONG TERM GOAL #3   Title report a 50% reduction in bil LE pain with standing for ADLs and self-care    Baseline --    Time 8    Period Weeks    Target Date 08/26/20      PT LONG TERM GOAL #4   Title Pt will report being able to stand and complete light household activity for up to 7-10 minutes without the need for a rest break.    Baseline --    Time 8    Period Weeks    Status New    Target Date 08/26/20      PT LONG TERM GOAL #5   Title perform 5x sit to stand in < or = to 15 seconds without UE support to reduce falls risk    Baseline --    Time 8     Period Weeks    Status New    Target Date 08/26/20                 Plan - 07/22/20 1318    Clinical Impression Statement Pt reports moderate compliance with HEP and added bil heel raises to her program last week.  Pt could tolerate 2# weights for LAQ and marching. Pt tolerated weightbearing strength minimally today without limitation or sign of fatigue. Addaday is helping to reduce LE tension post session.  Pt will continue to benefit from skilled PT to address LE pain with standing and progress strength and standing tolerance.    PT Frequency 2x / week    PT Duration 8 weeks    PT Treatment/Interventions ADLs/Self Care Home Management;Cryotherapy;Electrical Stimulation;Moist Heat;Gait training;Stair training;Functional mobility training;Therapeutic activities;Therapeutic exercise;Balance training;Neuromuscular re-education;Manual techniques;Passive range of motion;Dry needling;Taping    PT Next Visit Plan continue addaday to LEs, work on progressing standing strength and balance exercises    PT Home Exercise Plan Access Code: [email or web address]    Recommended Other Services initial cert is signed    Consulted and Agree with Plan of Care Patient           Patient will benefit from skilled therapeutic intervention in order to improve the following deficits and impairments:  Abnormal gait, Decreased activity tolerance, Decreased balance, Decreased strength, Postural dysfunction, Improper body mechanics, Impaired flexibility, Pain, Increased muscle spasms, Decreased endurance, Difficulty walking, Decreased range of motion  Visit Diagnosis: Muscle weakness (generalized)  Chronic low back pain, unspecified back pain laterality, unspecified whether sciatica present  Cramp and spasm  Difficulty in walking, not elsewhere classified  Pain in right hip  Pain in left hip     Problem List Patient Active Problem List   Diagnosis Date Noted   Vitamin D deficiency 05/30/2020   COPD (chronic  obstructive pulmonary disease) ([place]) 05/30/2020   Hyperglycemia 05/30/2020   CKD (chronic kidney disease) 05/30/2020   Lumbar spinal stenosis 05/30/2020    Sigurd Sos, PT 07/22/20 1:21 PM  [facility] [address] [place], [place], [address] Phone: [phone]   Fax:  [phone]  Name: Alison Lewis MRN: [id number] Date of Birth: August 05, 1936

## 2020-08-05 ENCOUNTER — Ambulatory Visit: Payer: Medicare Other | Admitting: Physical Therapy

## 2020-08-05 ENCOUNTER — Other Ambulatory Visit: Payer: Self-pay

## 2020-08-05 ENCOUNTER — Encounter: Payer: Self-pay | Admitting: Physical Therapy

## 2020-08-05 DIAGNOSIS — R252 Cramp and spasm: Secondary | ICD-10-CM

## 2020-08-05 DIAGNOSIS — M25551 Pain in right hip: Secondary | ICD-10-CM | POA: Diagnosis not present

## 2020-08-05 DIAGNOSIS — R262 Difficulty in walking, not elsewhere classified: Secondary | ICD-10-CM

## 2020-08-05 DIAGNOSIS — M6281 Muscle weakness (generalized): Secondary | ICD-10-CM

## 2020-08-05 DIAGNOSIS — M545 Low back pain, unspecified: Secondary | ICD-10-CM | POA: Diagnosis not present

## 2020-08-05 DIAGNOSIS — G8929 Other chronic pain: Secondary | ICD-10-CM | POA: Diagnosis not present

## 2020-08-05 NOTE — Therapy (Signed)
[facility] [address] [place], [place], [address] Phone: [phone]   Fax:  [phone]  Physical Therapy Treatment  Patient Details  Name: Alison Lewis MRN: [id number] Date of Birth: 11-19-1935 Referring Provider (PT): Suella Broad, MD   Encounter Date: 08/05/2020   PT End of Session - 08/05/20 1153    Visit Number 5    Date for PT Re-Evaluation 08/26/20    Authorization Type Medicare    Progress Note Due on Visit 10    PT Start Time 1148    PT Stop Time 1228    PT Time Calculation (min) 40 min    Activity Tolerance Patient tolerated treatment well    Behavior During Therapy [facility] for tasks assessed/performed           Past Medical History:  Diagnosis Date  . Allergy   . Cholecystitis   . Colon polyps   . Hip bursitis   . Hypertension   . Lumbar spinal stenosis   . Osteopenia   . Shingles   . Sinusitis   . Tobacco abuse     Past Surgical History:  Procedure Laterality Date  . CATARACT EXTRACTION, BILATERAL    . CHOLECYSTECTOMY      There were no vitals filed for this visit.   Subjective Assessment - 08/05/20 1154    Subjective My legs might be a little better.    Pertinent History lumbar spinal stenosis    Diagnostic tests none recent.  Old MRI- pt reports spinal stenosis.    Patient Stated Goals reduce bil leg pain, stand and walk longer    Currently in Pain? Yes    Pain Score 2     Pain Location --   quads   Pain Orientation Right;Left    Pain Descriptors / Indicators Dull;Aching    Aggravating Factors  standing and walking    Pain Relieving Factors sitting    Multiple Pain Sites No                             [facility] Adult PT Treatment/Exercise - 08/05/20 0001      Knee/Hip Exercises: Aerobic   Nustep L1 x 6 min with PTA present t odiscuss status      Knee/Hip Exercises: Seated   Long Arc Quad Strengthening;Both;2 sets;10 reps;Weights    Long Arc Quad  Weight --   2.5   Ball Squeeze 20x    Clamshell with TheraBand --   yellow loop 10x2   Marching AROM;Both;1 set;20 reps    Marching Limitations --   2.5#   Sit to Sand 1 set;10 reps;with UE support      Manual Therapy   Manual Therapy Soft tissue mobilization    Soft tissue mobilization Addaday assist bil quads and lateral quads 5 min each                    PT Short Term Goals - 07/01/20 1242      PT SHORT TERM GOAL #1   Title Pt will demo consistency and independence with her initial HEP to decrease pain and improve LE strength.    Time 4    Period Weeks    Status New    Target Date 07/29/20      PT SHORT TERM GOAL #2   Title report a 25% reduction in bil LE pain with standing    Time 4  Period Weeks    Target Date 07/29/20      PT SHORT TERM GOAL #3   Title perform 5x sit to stand in < or = to 19 seconds (with hands) to reduce falls risk    Time 4    Period Weeks    Status New    Target Date 07/29/20             PT Long Term Goals - 07/01/20 1252      PT LONG TERM GOAL #1   Title be independent in advanced HEP    Baseline --    Time 8    Period Weeks    Status New    Target Date 08/26/20      PT LONG TERM GOAL #2   Title verbalize and demonstrate body mechanics modifications for lumbar protection with ADLs and self-care at home    Baseline --    Time 8    Period Weeks    Status New    Target Date 08/26/20      PT LONG TERM GOAL #3   Title report a 50% reduction in bil LE pain with standing for ADLs and self-care    Baseline --    Time 8    Period Weeks    Target Date 08/26/20      PT LONG TERM GOAL #4   Title Pt will report being able to stand and complete light household activity for up to 7-10 minutes without the need for a rest break.    Baseline --    Time 8    Period Weeks    Status New    Target Date 08/26/20      PT LONG TERM GOAL #5   Title perform 5x sit to stand in < or = to 15 seconds without UE support to reduce  falls risk    Baseline --    Time 8    Period Weeks    Status New    Target Date 08/26/20                 Plan - 08/05/20 1153    Clinical Impression Statement Pt reports "some improvement" in less LE pain, maybe 10% or so. Pt still struggles to do any consistent HEP as she does not enjoy exercise.  Pt did tolerate an increase in resistance for LE strength.    Personal Factors and Comorbidities Age;Comorbidity 1    Comorbidities stenosis, cares for husband    Examination-Activity Limitations Stand;Stairs;Locomotion Level;Transfers    Examination-Participation Restrictions Church;Cleaning;Community Activity;Laundry;Shop;Meal Prep    Stability/Clinical Decision Making Evolving/Moderate complexity    Rehab Potential Good    PT Frequency 2x / week    PT Duration 8 weeks    PT Treatment/Interventions ADLs/Self Care Home Management;Cryotherapy;Electrical Stimulation;Moist Heat;Gait training;Stair training;Functional mobility training;Therapeutic activities;Therapeutic exercise;Balance training;Neuromuscular re-education;Manual techniques;Passive range of motion;Dry needling;Taping    PT Next Visit Plan continue addaday to LEs, work on progressing standing strength and balance exercises    PT Home Exercise Plan Access Code: [email or web address]    Consulted and Agree with Plan of Care Patient           Patient will benefit from skilled therapeutic intervention in order to improve the following deficits and impairments:  Abnormal gait, Decreased activity tolerance, Decreased balance, Decreased strength, Postural dysfunction, Improper body mechanics, Impaired flexibility, Pain, Increased muscle spasms, Decreased endurance, Difficulty walking, Decreased range of motion  Visit Diagnosis: Muscle weakness (generalized)  Chronic low back  pain, unspecified back pain laterality, unspecified whether sciatica present  Cramp and spasm  Difficulty in walking, not elsewhere classified     Problem  List Patient Active Problem List   Diagnosis Date Noted  . Vitamin D deficiency 05/30/2020  . COPD (chronic obstructive pulmonary disease) ([place]) 05/30/2020  . Hyperglycemia 05/30/2020  . CKD (chronic kidney disease) 05/30/2020  . Lumbar spinal stenosis 05/30/2020    Jovon Streetman, PTA 08/05/2020, 12:30 PM  [facility] [address] [place], [place], [address] Phone: [phone]   Fax:  [phone]  Name: LAKETIA VICKNAIR MRN: [id number] Date of Birth: 05/06/1936

## 2020-08-07 ENCOUNTER — Ambulatory Visit: Payer: Medicare Other | Admitting: Physical Therapy

## 2020-08-07 ENCOUNTER — Other Ambulatory Visit: Payer: Self-pay

## 2020-08-07 DIAGNOSIS — M25552 Pain in left hip: Secondary | ICD-10-CM

## 2020-08-07 DIAGNOSIS — M545 Low back pain, unspecified: Secondary | ICD-10-CM | POA: Diagnosis not present

## 2020-08-07 DIAGNOSIS — M25551 Pain in right hip: Secondary | ICD-10-CM | POA: Diagnosis not present

## 2020-08-07 DIAGNOSIS — G8929 Other chronic pain: Secondary | ICD-10-CM | POA: Diagnosis not present

## 2020-08-07 DIAGNOSIS — M6281 Muscle weakness (generalized): Secondary | ICD-10-CM

## 2020-08-07 DIAGNOSIS — R262 Difficulty in walking, not elsewhere classified: Secondary | ICD-10-CM

## 2020-08-07 DIAGNOSIS — R252 Cramp and spasm: Secondary | ICD-10-CM | POA: Diagnosis not present

## 2020-08-07 NOTE — Therapy (Signed)
[facility] [address] [place], [place], [address] Phone: [phone]   Fax:  [phone]  Physical Therapy Treatment  Patient Details  Name: Alison Lewis MRN: [id number] Date of Birth: Apr 20, 1936 Referring Provider (PT): Suella Broad, MD   Encounter Date: 08/07/2020   PT End of Session - 08/07/20 1235    Visit Number 6    Date for PT Re-Evaluation 08/26/20    Authorization Type Medicare    Progress Note Due on Visit 10    PT Start Time 1233    PT Stop Time 1311    PT Time Calculation (min) 38 min    Activity Tolerance Patient tolerated treatment well    Behavior During Therapy [facility] for tasks assessed/performed           Past Medical History:  Diagnosis Date  . Allergy   . Cholecystitis   . Colon polyps   . Hip bursitis   . Hypertension   . Lumbar spinal stenosis   . Osteopenia   . Shingles   . Sinusitis   . Tobacco abuse     Past Surgical History:  Procedure Laterality Date  . CATARACT EXTRACTION, BILATERAL    . CHOLECYSTECTOMY      There were no vitals filed for this visit.                      [place] Adult PT Treatment/Exercise - 08/07/20 0001      Knee/Hip Exercises: Aerobic   Nustep L1 x 6 min with PTA present to discuss status      Knee/Hip Exercises: Seated   Long Arc Quad Strengthening;Both;2 sets;10 reps;Weights    Long Arc Quad Weight --   2.5   Ball Squeeze 20x    Clamshell with TheraBand --   yellow loop 10x2   Marching AROM;Both;1 set;20 reps    Marching Limitations --   2.5#   Sit to Sand 1 set;10 reps;with UE support      Manual Therapy   Manual Therapy Soft tissue mobilization    Soft tissue mobilization Addaday assist bil quads and lateral quads 5 min each                    PT Short Term Goals - 07/01/20 1242      PT SHORT TERM GOAL #1   Title Pt will demo consistency and independence with her initial HEP to decrease pain and  improve LE strength.    Time 4    Period Weeks    Status New    Target Date 07/29/20      PT SHORT TERM GOAL #2   Title report a 25% reduction in bil LE pain with standing    Time 4    Period Weeks    Target Date 07/29/20      PT SHORT TERM GOAL #3   Title perform 5x sit to stand in < or = to 19 seconds (with hands) to reduce falls risk    Time 4    Period Weeks    Status New    Target Date 07/29/20             PT Long Term Goals - 07/01/20 1252      PT LONG TERM GOAL #1   Title be independent in advanced HEP    Baseline --    Time 8    Period Weeks    Status New  Target Date 08/26/20      PT LONG TERM GOAL #2   Title verbalize and demonstrate body mechanics modifications for lumbar protection with ADLs and self-care at home    Baseline --    Time 8    Period Weeks    Status New    Target Date 08/26/20      PT LONG TERM GOAL #3   Title report a 50% reduction in bil LE pain with standing for ADLs and self-care    Baseline --    Time 8    Period Weeks    Target Date 08/26/20      PT LONG TERM GOAL #4   Title Pt will report being able to stand and complete light household activity for up to 7-10 minutes without the need for a rest break.    Baseline --    Time 8    Period Weeks    Status New    Target Date 08/26/20      PT LONG TERM GOAL #5   Title perform 5x sit to stand in < or = to 15 seconds without UE support to reduce falls risk    Baseline --    Time 8    Period Weeks    Status New    Target Date 08/26/20                 Plan - 08/07/20 1236    Clinical Impression Statement Pt reports today that her legs felt so good after the last session she wanted to replicate the same treatment and then go the store after her sesion to see how they did walking around the store.    Personal Factors and Comorbidities Age;Comorbidity 1    Comorbidities stenosis, cares for husband    Examination-Activity Limitations Stand;Stairs;Locomotion  Level;Transfers    Examination-Participation Restrictions Church;Cleaning;Community Activity;Laundry;Shop;Meal Prep    Stability/Clinical Decision Making Evolving/Moderate complexity    Rehab Potential Good    PT Frequency 2x / week    PT Duration 8 weeks    PT Treatment/Interventions ADLs/Self Care Home Management;Cryotherapy;Electrical Stimulation;Moist Heat;Gait training;Stair training;Functional mobility training;Therapeutic activities;Therapeutic exercise;Balance training;Neuromuscular re-education;Manual techniques;Passive range of motion;Dry needling;Taping    PT Next Visit Plan continue addaday to LEs, work on progressing standing strength and balance exercises: see how pt did when she went to the store after last session    PT Home Exercise Plan Access Code: [email or web address]    Consulted and Agree with Plan of Care Patient           Patient will benefit from skilled therapeutic intervention in order to improve the following deficits and impairments:  Abnormal gait, Decreased activity tolerance, Decreased balance, Decreased strength, Postural dysfunction, Improper body mechanics, Impaired flexibility, Pain, Increased muscle spasms, Decreased endurance, Difficulty walking, Decreased range of motion  Visit Diagnosis: Muscle weakness (generalized)  Chronic low back pain, unspecified back pain laterality, unspecified whether sciatica present  Cramp and spasm  Difficulty in walking, not elsewhere classified  Pain in right hip  Pain in left hip     Problem List Patient Active Problem List   Diagnosis Date Noted  . Vitamin D deficiency 05/30/2020  . COPD (chronic obstructive pulmonary disease) ([place]) 05/30/2020  . Hyperglycemia 05/30/2020  . CKD (chronic kidney disease) 05/30/2020  . Lumbar spinal stenosis 05/30/2020    Carlea Badour,PTA 08/07/2020, 1:10 PM  [facility] [address] [place], [place],  [address] Phone: [phone]   Fax:  [phone]  Name: SONAKSHI ROLLAND MRN: [id number] Date of Birth: Mar 10, 1936

## 2020-08-12 ENCOUNTER — Ambulatory Visit: Payer: Medicare Other | Admitting: Physical Therapy

## 2020-08-12 ENCOUNTER — Other Ambulatory Visit: Payer: Self-pay

## 2020-08-12 ENCOUNTER — Encounter: Payer: Self-pay | Admitting: Physical Therapy

## 2020-08-12 DIAGNOSIS — M25551 Pain in right hip: Secondary | ICD-10-CM | POA: Diagnosis not present

## 2020-08-12 DIAGNOSIS — G8929 Other chronic pain: Secondary | ICD-10-CM | POA: Diagnosis not present

## 2020-08-12 DIAGNOSIS — M545 Low back pain, unspecified: Secondary | ICD-10-CM | POA: Diagnosis not present

## 2020-08-12 DIAGNOSIS — M6281 Muscle weakness (generalized): Secondary | ICD-10-CM | POA: Diagnosis not present

## 2020-08-12 DIAGNOSIS — R252 Cramp and spasm: Secondary | ICD-10-CM | POA: Diagnosis not present

## 2020-08-12 DIAGNOSIS — R262 Difficulty in walking, not elsewhere classified: Secondary | ICD-10-CM

## 2020-08-12 NOTE — Therapy (Signed)
[facility] [address] [place], [place], [address] Phone: [phone]   Fax:  [phone]  Physical Therapy Treatment  Patient Details  Name: Alison Lewis MRN: [id number] Date of Birth: October 07, 1935 Referring Provider (PT): Suella Broad, MD   Encounter Date: 08/12/2020   PT End of Session - 08/12/20 1239    Visit Number 7    Date for PT Re-Evaluation 08/26/20    Authorization Type Medicare    Progress Note Due on Visit 10    PT Start Time 1234    PT Stop Time 1312    PT Time Calculation (min) 38 min    Activity Tolerance Patient tolerated treatment well    Behavior During Therapy [facility] for tasks assessed/performed           Past Medical History:  Diagnosis Date  . Allergy   . Cholecystitis   . Colon polyps   . Hip bursitis   . Hypertension   . Lumbar spinal stenosis   . Osteopenia   . Shingles   . Sinusitis   . Tobacco abuse     Past Surgical History:  Procedure Laterality Date  . CATARACT EXTRACTION, BILATERAL    . CHOLECYSTECTOMY      There were no vitals filed for this visit.   Subjective Assessment - 08/12/20 1241    Subjective Trip to [employer] went ok! Today I am not so full of energy and my legs hurt a little.    Pertinent History lumbar spinal stenosis    Currently in Pain? Yes    Pain Score 3     Pain Location --   quads   Pain Orientation Right;Left    Pain Descriptors / Indicators Dull;Aching    Aggravating Factors  Standing and walking    Pain Relieving Factors sitting    Multiple Pain Sites No                             [facility] Adult PT Treatment/Exercise - 08/12/20 0001      Knee/Hip Exercises: Aerobic   Nustep L2 x 6 min with PTA present      Knee/Hip Exercises: Seated   Long Arc Quad Strengthening;Both;2 sets;15 reps;Weights    Long Arc Quad Weight --   2.5   Ball Squeeze 20x    Clamshell with TheraBand --   red loop 2x15   Marching  AROM;Both;1 set;20 reps    Marching Limitations --   2.5#   Sit to Sand 2 sets;10 reps;with UE support      Manual Therapy   Manual Therapy Soft tissue mobilization    Soft tissue mobilization Addaday assist bil quads and lateral quads 5 min each                    PT Short Term Goals - 07/01/20 1242      PT SHORT TERM GOAL #1   Title Pt will demo consistency and independence with her initial HEP to decrease pain and improve LE strength.    Time 4    Period Weeks    Status New    Target Date 07/29/20      PT SHORT TERM GOAL #2   Title report a 25% reduction in bil LE pain with standing    Time 4    Period Weeks    Target Date 07/29/20      PT SHORT TERM  GOAL #3   Title perform 5x sit to stand in < or = to 19 seconds (with hands) to reduce falls risk    Time 4    Period Weeks    Status New    Target Date 07/29/20             PT Long Term Goals - 07/01/20 1252      PT LONG TERM GOAL #1   Title be independent in advanced HEP    Baseline --    Time 8    Period Weeks    Status New    Target Date 08/26/20      PT LONG TERM GOAL #2   Title verbalize and demonstrate body mechanics modifications for lumbar protection with ADLs and self-care at home    Baseline --    Time 8    Period Weeks    Status New    Target Date 08/26/20      PT LONG TERM GOAL #3   Title report a 50% reduction in bil LE pain with standing for ADLs and self-care    Baseline --    Time 8    Period Weeks    Target Date 08/26/20      PT LONG TERM GOAL #4   Title Pt will report being able to stand and complete light household activity for up to 7-10 minutes without the need for a rest break.    Baseline --    Time 8    Period Weeks    Status New    Target Date 08/26/20      PT LONG TERM GOAL #5   Title perform 5x sit to stand in < or = to 15 seconds without UE support to reduce falls risk    Baseline --    Time 8    Period Weeks    Status New    Target Date 08/26/20                   Plan - 08/12/20 1249    Clinical Impression Statement Pt reports successful shopping trip to [place] last week . This was a trip she had been previously avoiding due to increasing quad pain. Pt feels current weights are as heavy as she wants to go right now so we advanced exercises in sets and reps.    Personal Factors and Comorbidities Age;Comorbidity 1    Comorbidities stenosis, cares for husband    Examination-Activity Limitations Stand;Stairs;Locomotion Level;Transfers    Examination-Participation Restrictions Church;Cleaning;Community Activity;Laundry;Shop;Meal Prep    Stability/Clinical Decision Making Evolving/Moderate complexity    Rehab Potential Good    PT Frequency 2x / week    PT Duration 8 weeks    PT Treatment/Interventions ADLs/Self Care Home Management;Cryotherapy;Electrical Stimulation;Moist Heat;Gait training;Stair training;Functional mobility training;Therapeutic activities;Therapeutic exercise;Balance training;Neuromuscular re-education;Manual techniques;Passive range of motion;Dry needling;Taping    PT Next Visit Plan continue addaday to LEs, work on progressing standing strength and balance exercises.    PT Home Exercise Plan Access Code: [email or web address]    Consulted and Agree with Plan of Care Patient           Patient will benefit from skilled therapeutic intervention in order to improve the following deficits and impairments:  Abnormal gait, Decreased activity tolerance, Decreased balance, Decreased strength, Postural dysfunction, Improper body mechanics, Impaired flexibility, Pain, Increased muscle spasms, Decreased endurance, Difficulty walking, Decreased range of motion  Visit Diagnosis: Muscle weakness (generalized)  Chronic low back pain, unspecified back pain laterality,  unspecified whether sciatica present  Cramp and spasm  Difficulty in walking, not elsewhere classified     Problem List Patient Active Problem List   Diagnosis Date  Noted  . Vitamin D deficiency 05/30/2020  . COPD (chronic obstructive pulmonary disease) ([place]) 05/30/2020  . Hyperglycemia 05/30/2020  . CKD (chronic kidney disease) 05/30/2020  . Lumbar spinal stenosis 05/30/2020    Danett Palazzo, PTA 08/12/2020, 1:12 PM  [facility] [address], [place], [place], [address] Phone: [phone]   Fax:  [phone]  Name: Alison Lewis MRN: [id number] Date of Birth: [birth year]

## 2020-08-19 ENCOUNTER — Other Ambulatory Visit: Payer: Self-pay

## 2020-08-19 ENCOUNTER — Ambulatory Visit: Payer: Medicare Other

## 2020-08-19 DIAGNOSIS — R252 Cramp and spasm: Secondary | ICD-10-CM

## 2020-08-19 DIAGNOSIS — G8929 Other chronic pain: Secondary | ICD-10-CM

## 2020-08-19 DIAGNOSIS — M6281 Muscle weakness (generalized): Secondary | ICD-10-CM

## 2020-08-19 DIAGNOSIS — R262 Difficulty in walking, not elsewhere classified: Secondary | ICD-10-CM

## 2020-08-19 DIAGNOSIS — M545 Low back pain, unspecified: Secondary | ICD-10-CM | POA: Diagnosis not present

## 2020-08-19 DIAGNOSIS — M25551 Pain in right hip: Secondary | ICD-10-CM | POA: Diagnosis not present

## 2020-08-19 NOTE — Therapy (Signed)
[facility] [address] [place], [place], [address] Phone: [phone]   Fax:  [phone]  Physical Therapy Treatment  Patient Details  Name: Alison Lewis MRN: [id number] Date of Birth: 1936/08/28 Referring Provider (PT): Suella Broad, MD   Encounter Date: 08/19/2020   PT End of Session - 08/19/20 1536    Visit Number 8    Date for PT Re-Evaluation 08/26/20    Authorization Type Medicare    Progress Note Due on Visit 10    PT Start Time 1447    PT Stop Time 1531    PT Time Calculation (min) 44 min    Activity Tolerance Patient tolerated treatment well    Behavior During Therapy [facility] for tasks assessed/performed           Past Medical History:  Diagnosis Date   Allergy    Cholecystitis    Colon polyps    Hip bursitis    Hypertension    Lumbar spinal stenosis    Osteopenia    Shingles    Sinusitis    Tobacco abuse     Past Surgical History:  Procedure Laterality Date   CATARACT EXTRACTION, BILATERAL     CHOLECYSTECTOMY      There were no vitals filed for this visit.   Subjective Assessment - 08/19/20 1452    Subjective My legs feel like they are a little better and would probably be better if I exercisesd more faithfully.    Patient Stated Goals reduce bil leg pain, stand and walk longer    Currently in Pain? Yes    Pain Score 3     Pain Location Leg    Pain Orientation Left;Right    Aggravating Factors  standing and walking    Pain Relieving Factors sitting                             [facility] Adult PT Treatment/Exercise - 08/19/20 0001      Knee/Hip Exercises: Aerobic   Nustep L2 x 6 min with PT present      Knee/Hip Exercises: Standing   Heel Raises Both;1 set;20 reps    Rebounder weight shifting 3 ways x 1 minute each    Other Standing Knee Exercises alternating step taps on 6" step with UE support x 20      Knee/Hip Exercises: Seated   Long Arc  Quad Strengthening;Both;2 sets;15 reps;Weights    Long Arc Quad Weight --   2.5   Clamshell with TheraBand --   red loop 2x15   Marching AROM;Both;1 set;20 reps    Marching Limitations --   2.5#   Sit to Sand 2 sets;10 reps;with UE support      Manual Therapy   Manual Therapy Soft tissue mobilization    Soft tissue mobilization Addaday assist bil quads and lateral quads 5 min each                    PT Short Term Goals - 08/19/20 1454      PT SHORT TERM GOAL #1   Title Pt will demo consistency and independence with her initial HEP to decrease pain and improve LE strength.    Status Achieved      PT SHORT TERM GOAL #2   Title report a 25% reduction in bil LE pain with standing    Baseline 10% better    Time 4  Period Weeks    Status On-going             PT Long Term Goals - 07/01/20 1252      PT LONG TERM GOAL #1   Title be independent in advanced HEP    Baseline --    Time 8    Period Weeks    Status New    Target Date 08/26/20      PT LONG TERM GOAL #2   Title verbalize and demonstrate body mechanics modifications for lumbar protection with ADLs and self-care at home    Baseline --    Time 8    Period Weeks    Status New    Target Date 08/26/20      PT LONG TERM GOAL #3   Title report a 50% reduction in bil LE pain with standing for ADLs and self-care    Baseline --    Time 8    Period Weeks    Target Date 08/26/20      PT LONG TERM GOAL #4   Title Pt will report being able to stand and complete light household activity for up to 7-10 minutes without the need for a rest break.    Baseline --    Time 8    Period Weeks    Status New    Target Date 08/26/20      PT LONG TERM GOAL #5   Title perform 5x sit to stand in < or = to 15 seconds without UE support to reduce falls risk    Baseline --    Time 8    Period Weeks    Status New    Target Date 08/26/20                 Plan - 08/19/20 1500    Clinical Impression Statement I  bought a massage gun for my quads.  I havent used it yet.  Pt reports 10% reduction in her LE pain overall.  Pt was able to shop at [employer] last week and this was a good progression for her. Pt plans to make more grocery trips to buy fresh produce. Pt requires close supervision for exercise to provide verbal and tactile cues.  Pt is challenged by current level of exercise.  Pt will continue to benefit from skilled PT to address LE strength, flexibility and endurance.    PT Frequency 2x / week    PT Duration 8 weeks    PT Treatment/Interventions ADLs/Self Care Home Management;Cryotherapy;Electrical Stimulation;Moist Heat;Gait training;Stair training;Functional mobility training;Therapeutic activities;Therapeutic exercise;Balance training;Neuromuscular re-education;Manual techniques;Passive range of motion;Dry needling;Taping    PT Next Visit Plan continue addaday to LEs, work on progressing standing strength and balance exercises.    PT Home Exercise Plan Access Code: [email or web address]    Consulted and Agree with Plan of Care Patient           Patient will benefit from skilled therapeutic intervention in order to improve the following deficits and impairments:  Abnormal gait, Decreased activity tolerance, Decreased balance, Decreased strength, Postural dysfunction, Improper body mechanics, Impaired flexibility, Pain, Increased muscle spasms, Decreased endurance, Difficulty walking, Decreased range of motion  Visit Diagnosis: Chronic low back pain, unspecified back pain laterality, unspecified whether sciatica present  Muscle weakness (generalized)  Cramp and spasm  Difficulty in walking, not elsewhere classified     Problem List Patient Active Problem List   Diagnosis Date Noted   Vitamin D deficiency 05/30/2020   COPD (chronic  obstructive pulmonary disease) ([place]) 05/30/2020   Hyperglycemia 05/30/2020   CKD (chronic kidney disease) 05/30/2020   Lumbar spinal stenosis 05/30/2020      Sigurd Sos, PT 08/19/20 3:41 PM  [facility] [address], [place], [place], [address] Phone: [phone]   Fax:  [phone]  Name: Alison Lewis MRN: [id number] Date of Birth: 1936/05/23

## 2020-08-21 ENCOUNTER — Other Ambulatory Visit: Payer: Self-pay

## 2020-08-21 ENCOUNTER — Ambulatory Visit: Payer: Medicare Other | Attending: Physical Medicine and Rehabilitation

## 2020-08-21 DIAGNOSIS — R262 Difficulty in walking, not elsewhere classified: Secondary | ICD-10-CM | POA: Diagnosis not present

## 2020-08-21 DIAGNOSIS — M25551 Pain in right hip: Secondary | ICD-10-CM | POA: Insufficient documentation

## 2020-08-21 DIAGNOSIS — M545 Low back pain, unspecified: Secondary | ICD-10-CM | POA: Insufficient documentation

## 2020-08-21 DIAGNOSIS — G8929 Other chronic pain: Secondary | ICD-10-CM | POA: Insufficient documentation

## 2020-08-21 DIAGNOSIS — M25552 Pain in left hip: Secondary | ICD-10-CM | POA: Insufficient documentation

## 2020-08-21 DIAGNOSIS — M6281 Muscle weakness (generalized): Secondary | ICD-10-CM | POA: Insufficient documentation

## 2020-08-21 DIAGNOSIS — R252 Cramp and spasm: Secondary | ICD-10-CM | POA: Diagnosis not present

## 2020-08-21 NOTE — Therapy (Signed)
[facility] [address], [place], [place], [address] Phone: [phone]   Fax:  [phone]  Physical Therapy Treatment  Patient Details  Name: Alison Lewis MRN: [id number] Date of Birth: 15-Jul-1936 Referring Provider (PT): Suella Broad, MD   Encounter Date: 08/21/2020   PT End of Session - 08/21/20 1309    Visit Number 9    Date for PT Re-Evaluation 08/26/20    Authorization Type Medicare    Progress Note Due on Visit 10    PT Start Time 1241   late   PT Stop Time 1310    PT Time Calculation (min) 29 min    Activity Tolerance Patient tolerated treatment well    Behavior During Therapy [facility] for tasks assessed/performed           Past Medical History:  Diagnosis Date  . Allergy   . Cholecystitis   . Colon polyps   . Hip bursitis   . Hypertension   . Lumbar spinal stenosis   . Osteopenia   . Shingles   . Sinusitis   . Tobacco abuse     Past Surgical History:  Procedure Laterality Date  . CATARACT EXTRACTION, BILATERAL    . CHOLECYSTECTOMY      There were no vitals filed for this visit.                      [facility] Adult PT Treatment/Exercise - 08/21/20 0001      Knee/Hip Exercises: Aerobic   Nustep L2 x 6 min with PT present      Knee/Hip Exercises: Standing   Heel Raises Both;1 set;20 reps      Knee/Hip Exercises: Seated   Long Arc Quad Strengthening;Both;Weights;20 reps    Long Arc Quad Weight --   2.5   Ball Squeeze 5" hold x20    Clamshell with TheraBand --   red loop 2x15   Marching AROM;Both;1 set;20 reps    Marching Limitations --   2.5#   Hamstring Curl Strengthening;Both;2 sets;10 reps    Hamstring Limitations red loop band    Sit to Sand 2 sets;10 reps;with UE support      Manual Therapy   Manual Therapy Soft tissue mobilization    Soft tissue mobilization Addaday assist bil quads and lateral quads 5 min each                    PT Short Term  Goals - 08/19/20 1454      PT SHORT TERM GOAL #1   Title Pt will demo consistency and independence with her initial HEP to decrease pain and improve LE strength.    Status Achieved      PT SHORT TERM GOAL #2   Title report a 25% reduction in bil LE pain with standing    Baseline 10% better    Time 4    Period Weeks    Status On-going             PT Long Term Goals - 07/01/20 1252      PT LONG TERM GOAL #1   Title be independent in advanced HEP    Baseline --    Time 8    Period Weeks    Status New    Target Date 08/26/20      PT LONG TERM GOAL #2   Title verbalize and demonstrate body mechanics modifications for lumbar protection with ADLs and self-care at  home    Baseline --    Time 8    Period Weeks    Status New    Target Date 08/26/20      PT LONG TERM GOAL #3   Title report a 50% reduction in bil LE pain with standing for ADLs and self-care    Baseline --    Time 8    Period Weeks    Target Date 08/26/20      PT LONG TERM GOAL #4   Title Pt will report being able to stand and complete light household activity for up to 7-10 minutes without the need for a rest break.    Baseline --    Time 8    Period Weeks    Status New    Target Date 08/26/20      PT LONG TERM GOAL #5   Title perform 5x sit to stand in < or = to 15 seconds without UE support to reduce falls risk    Baseline --    Time 8    Period Weeks    Status New    Target Date 08/26/20                 Plan - 08/21/20 1244    Clinical Impression Statement Pt arrived with report that we did too much last session as she had increased pain in the Rt LE the next day.  Pt reports 3/10 Lt LE pain yesterday and 2/10 Lt LE pain today.  PT scaled back exercises to only included exercises performed 2 sessions ago to see how this impacts her pain after. Pt requires close supervision for exercise to provide verbal and tactile cues.  Pt is challenged by current level of exercise.  Pt will continue to  benefit from skilled PT to address LE strength, flexibility and endurance.    Stability/Clinical Decision Making Evolving/Moderate complexity    Rehab Potential Good    PT Frequency 2x / week    PT Duration 8 weeks    PT Treatment/Interventions ADLs/Self Care Home Management;Cryotherapy;Electrical Stimulation;Moist Heat;Gait training;Stair training;Functional mobility training;Therapeutic activities;Therapeutic exercise;Balance training;Neuromuscular re-education;Manual techniques;Passive range of motion;Dry needling;Taping    PT Next Visit Plan continue addaday to LEs, work on progressing standing strength and balance exercises.  10th visit summary    PT Home Exercise Plan Access Code: [email or web address]    Consulted and Agree with Plan of Care Patient           Patient will benefit from skilled therapeutic intervention in order to improve the following deficits and impairments:  Abnormal gait, Decreased activity tolerance, Decreased balance, Decreased strength, Postural dysfunction, Improper body mechanics, Impaired flexibility, Pain, Increased muscle spasms, Decreased endurance, Difficulty walking, Decreased range of motion  Visit Diagnosis: Chronic low back pain, unspecified back pain laterality, unspecified whether sciatica present  Muscle weakness (generalized)  Cramp and spasm  Difficulty in walking, not elsewhere classified     Problem List Patient Active Problem List   Diagnosis Date Noted  . Vitamin D deficiency 05/30/2020  . COPD (chronic obstructive pulmonary disease) ([place]) 05/30/2020  . Hyperglycemia 05/30/2020  . CKD (chronic kidney disease) 05/30/2020  . Lumbar spinal stenosis 05/30/2020    Sigurd Sos, PT 08/21/20 1:11 PM  [facility] [address] [place], [place], [address] Phone: [phone]   Fax:  [phone]  Name: HAWRAA STAMBAUGH MRN: [id number] Date of Birth: 02/12/1936

## 2020-08-26 ENCOUNTER — Ambulatory Visit: Payer: Medicare Other | Admitting: Physical Therapy

## 2020-08-26 ENCOUNTER — Other Ambulatory Visit: Payer: Self-pay

## 2020-08-26 ENCOUNTER — Encounter: Payer: Self-pay | Admitting: Physical Therapy

## 2020-08-26 DIAGNOSIS — R262 Difficulty in walking, not elsewhere classified: Secondary | ICD-10-CM | POA: Diagnosis not present

## 2020-08-26 DIAGNOSIS — G8929 Other chronic pain: Secondary | ICD-10-CM | POA: Diagnosis not present

## 2020-08-26 DIAGNOSIS — M545 Low back pain, unspecified: Secondary | ICD-10-CM

## 2020-08-26 DIAGNOSIS — M6281 Muscle weakness (generalized): Secondary | ICD-10-CM | POA: Diagnosis not present

## 2020-08-26 DIAGNOSIS — R252 Cramp and spasm: Secondary | ICD-10-CM | POA: Diagnosis not present

## 2020-08-26 DIAGNOSIS — M25551 Pain in right hip: Secondary | ICD-10-CM | POA: Diagnosis not present

## 2020-08-26 NOTE — Addendum Note (Signed)
Addended by: Danie Binder on: 08/26/2020 05:11 PM   Modules accepted: Orders

## 2020-08-26 NOTE — Therapy (Addendum)
[facility] [address] [place], [place], [address] Phone: [phone]   Fax:  [phone]  Physical Therapy Treatment  Patient Details  Name: Alison Lewis MRN: [id number] Date of Birth: September 23, 1935 Referring Provider (PT): Suella Broad, MD   Encounter Date: 08/26/2020 Progress Note Reporting Period 07/01/20 to 08/26/20  See note below for Objective Data and Assessment of Progress/Goals.       PT End of Session - 08/26/20 1230    Visit Number 10    Date for PT Re-Evaluation 09/20/20    Authorization Type Medicare    Progress Note Due on Visit 20    PT Start Time 1229   low level tolerance   PT Stop Time 1301    PT Time Calculation (min) 32 min    Activity Tolerance Patient tolerated treatment well;Patient limited by lethargy    Behavior During Therapy WFL for tasks assessed/performed           Past Medical History:  Diagnosis Date  . Allergy   . Cholecystitis   . Colon polyps   . Hip bursitis   . Hypertension   . Lumbar spinal stenosis   . Osteopenia   . Shingles   . Sinusitis   . Tobacco abuse     Past Surgical History:  Procedure Laterality Date  . CATARACT EXTRACTION, BILATERAL    . CHOLECYSTECTOMY      There were no vitals filed for this visit.   Subjective Assessment - 08/26/20 1231    Subjective My legs are sore today.    Pertinent History lumbar spinal stenosis    Patient Stated Goals reduce bil leg pain, stand and walk longer    Currently in Pain? Yes    Pain Score 3     Pain Location Leg    Pain Orientation Right;Left    Pain Descriptors / Indicators Sore    Aggravating Factors  standing and walking    Pain Relieving Factors sitting down    Multiple Pain Sites No              [facility] PT Assessment - 08/26/20 0001      Assessment   Medical Diagnosis Degeneration of intervertebral disc    Referring Provider (PT) Suella Broad, MD    Onset Date/Surgical Date  07/01/17   approximate     Prior Function   Level of Independence Independent      Observation/Other Assessments   Focus on Therapeutic Outcomes (FOTO)  56% limited      Posture/Postural Control   Posture/Postural Control Postural limitations    Postural Limitations Forward head;Rounded Shoulders;Increased thoracic kyphosis;Flexed trunk      Strength   Overall Strength Comments Bil ankle DF 4/5, Lt quad 4/5, RT 4-/5, Bil hip flexor 4-/5, RT hip ER 4/5, LT 4-/5       Transfers   Transfers Stand to Sit;Sit to Stand    Five time sit to stand comments  17 sec with UE on rails    Stand to Sit With upper extremity assist                         [facility] Adult PT Treatment/Exercise - 08/26/20 0001      Lumbar Exercises: Standing   Heel Raises 15 reps      Knee/Hip Exercises: Aerobic   Nustep L2 x 8 min with PTA present      Knee/Hip Exercises: Seated  Long Arc [employer] Strengthening;Both;1 set;10 reps;Weights    Long Arc [employer] 3 lbs.    Ball Squeeze 5" hold x20    Clamshell with TheraBand --   red loop 2x15   Marching Strengthening;Both;1 set;10 reps;Weights    Marching Limitations 3                    PT Short Term Goals - 08/26/20 1234      PT SHORT TERM GOAL #1   Title Pt will demo consistency and independence with her initial HEP to decrease pain and improve LE strength.    Period Weeks    Status Achieved    Target Date 07/29/20      PT SHORT TERM GOAL #2   Title report a 25% reduction in bil LE pain with standing    Baseline 10% better    Time 4    Period Weeks    Status On-going   10%   Target Date 07/29/20             PT Long Term Goals - 08/26/20 1707      PT LONG TERM GOAL #1   Title be independent in advanced HEP    Time 4    Period Weeks    Status On-going    Target Date 09/20/20      PT LONG TERM GOAL #2   Title verbalize and demonstrate body mechanics modifications for lumbar protection with ADLs and self-care at  home    Status Achieved      PT LONG TERM GOAL #3   Title report a 50% reduction in bil LE pain with standing for ADLs and self-care    Time 4    Period Weeks    Status On-going    Target Date 09/20/20      PT LONG TERM GOAL #4   Title Pt will report being able to stand and complete light household activity for up to 7-10 minutes without the need for a rest break.    Time 4    Period Weeks    Status On-going    Target Date 09/20/20      PT LONG TERM GOAL #5   Title perform 5x sit to stand in < or = to 15 seconds without UE support to reduce falls risk    Baseline 17 seconds    Time 4    Period Weeks    Status On-going    Target Date 09/20/20                 Plan - 08/26/20 1231    Clinical Impression Statement Pt arrives with mild quad pain. Pt reports her greatest improvement have been with standing for household activities, having less pain in her legs. Longer community distances are not as improved but is SIGHTLY improved.  Pt improved sit to stand 5x to 17 sec today. Pt also reports she has less pain in the afternoons/late in the day than prior to coming to PT. Pt will continue to benefit from skilled PT to improve strength, balance and address LE and low back pain that limits fuctional endurance at home and in the community.    Personal Factors and Comorbidities Age;Comorbidity 1    Comorbidities stenosis, cares for husband    Examination-Activity Limitations Stand;Stairs;Locomotion Level;Transfers    Examination-Participation Restrictions Church;Cleaning;Community Activity;Laundry;Shop;Meal Prep    Stability/Clinical Decision Making Evolving/Moderate complexity    Rehab Potential Good    PT Frequency 2x /  week    PT Duration 4 weeks    PT Treatment/Interventions ADLs/Self Care Home Management;Cryotherapy;Electrical Stimulation;Moist Heat;Gait training;Stair training;Functional mobility training;Therapeutic activities;Therapeutic exercise;Balance  training;Neuromuscular re-education;Manual techniques;Passive range of motion;Dry needling;Taping    PT Next Visit Plan Continue to address LE strength, enduarnce, balance and leg pain to improve safety and independence.    PT Home Exercise Plan Access Code: [email or web address]    Recommended Other Services recert sent 82/8/00    Consulted and Agree with Plan of Care Patient           Patient will benefit from skilled therapeutic intervention in order to improve the following deficits and impairments:  Abnormal gait, Decreased activity tolerance, Decreased balance, Decreased strength, Postural dysfunction, Improper body mechanics, Impaired flexibility, Pain, Increased muscle spasms, Decreased endurance, Difficulty walking, Decreased range of motion  Visit Diagnosis: Chronic low back pain, unspecified back pain laterality, unspecified whether sciatica present - Plan: PT plan of care cert/re-cert  Muscle weakness (generalized) - Plan: PT plan of care cert/re-cert  Cramp and spasm - Plan: PT plan of care cert/re-cert  Difficulty in walking, not elsewhere classified - Plan: PT plan of care cert/re-cert     Problem List Patient Active Problem List   Diagnosis Date Noted  . Vitamin D deficiency 05/30/2020  . COPD (chronic obstructive pulmonary disease) ([place]) 05/30/2020  . Hyperglycemia 05/30/2020  . CKD (chronic kidney disease) 05/30/2020  . Lumbar spinal stenosis 05/30/2020    Myrene Galas, PTA 08/26/20 5:10 PM Sigurd Sos, PT 08/26/20 5:10 PM  [facility] [address], [place], [place], [address] Phone: [phone]   Fax:  [phone]  Name: LIVIYA SANTINI MRN: [id number] Date of Birth: 12/07/1935

## 2020-08-28 ENCOUNTER — Ambulatory Visit: Payer: Medicare Other

## 2020-08-28 ENCOUNTER — Other Ambulatory Visit: Payer: Self-pay

## 2020-08-28 DIAGNOSIS — M6281 Muscle weakness (generalized): Secondary | ICD-10-CM

## 2020-08-28 DIAGNOSIS — R252 Cramp and spasm: Secondary | ICD-10-CM | POA: Diagnosis not present

## 2020-08-28 DIAGNOSIS — R262 Difficulty in walking, not elsewhere classified: Secondary | ICD-10-CM | POA: Diagnosis not present

## 2020-08-28 DIAGNOSIS — G8929 Other chronic pain: Secondary | ICD-10-CM

## 2020-08-28 DIAGNOSIS — M25551 Pain in right hip: Secondary | ICD-10-CM | POA: Diagnosis not present

## 2020-08-28 DIAGNOSIS — M545 Low back pain, unspecified: Secondary | ICD-10-CM

## 2020-08-28 NOTE — Therapy (Signed)
[facility] [address] [place], [place], [address] Phone: [phone]   Fax:  [phone]  Physical Therapy Treatment  Patient Details  Name: Alison Lewis MRN: [id number] Date of Birth: 1936/04/05 Referring Provider (PT): Suella Broad, MD   Encounter Date: 08/28/2020   PT End of Session - 08/28/20 1309    Visit Number 11    Date for PT Re-Evaluation 09/20/20    Authorization Type Medicare    Progress Note Due on Visit 20    PT Start Time 1236    PT Stop Time 1307    PT Time Calculation (min) 31 min    Activity Tolerance Patient tolerated treatment well;Patient limited by lethargy    Behavior During Therapy [facility] for tasks assessed/performed           Past Medical History:  Diagnosis Date  . Allergy   . Cholecystitis   . Colon polyps   . Hip bursitis   . Hypertension   . Lumbar spinal stenosis   . Osteopenia   . Shingles   . Sinusitis   . Tobacco abuse     Past Surgical History:  Procedure Laterality Date  . CATARACT EXTRACTION, BILATERAL    . CHOLECYSTECTOMY      There were no vitals filed for this visit.   Subjective Assessment - 08/28/20 1237    Subjective I am so/so.  I got my Addaday out of the box and now I need to learn to use it.    Currently in Pain? Yes    Pain Score 3     Pain Location Leg    Pain Orientation Right;Left    Pain Descriptors / Indicators Sore    Pain Type Chronic pain    Pain Onset More than a month ago    Pain Frequency Intermittent                             [facility] Adult PT Treatment/Exercise - 08/28/20 0001      Lumbar Exercises: Standing   Row Strengthening;Both;20 reps;Theraband    Shoulder Extension Strengthening;20 reps;Theraband    Theraband Level (Shoulder Extension) Level 2 (Red)      Knee/Hip Exercises: Seated   Long Arc Quad Strengthening;Both;1 set;10 reps;Weights    Long Arc Quad Weight 3 lbs.    Ball Squeeze 5" hold  x20    Clamshell with TheraBand --   red loop 2x15   Marching Strengthening;Both;1 set;10 reps;Weights    Marching Limitations 3      Manual Therapy   Manual Therapy Soft tissue mobilization    Soft tissue mobilization Addaday assist bil quads and lateral quads 5 min each                    PT Short Term Goals - 08/26/20 1234      PT SHORT TERM GOAL #1   Title Pt will demo consistency and independence with her initial HEP to decrease pain and improve LE strength.    Period Weeks    Status Achieved    Target Date 07/29/20      PT SHORT TERM GOAL #2   Title report a 25% reduction in bil LE pain with standing    Baseline 10% better    Time 4    Period Weeks    Status On-going   10%   Target Date 07/29/20  PT Long Term Goals - 08/26/20 1707      PT LONG TERM GOAL #1   Title be independent in advanced HEP    Time 4    Period Weeks    Status On-going    Target Date 09/20/20      PT LONG TERM GOAL #2   Title verbalize and demonstrate body mechanics modifications for lumbar protection with ADLs and self-care at home    Status Achieved      PT LONG TERM GOAL #3   Title report a 50% reduction in bil LE pain with standing for ADLs and self-care    Time 4    Period Weeks    Status On-going    Target Date 09/20/20      PT LONG TERM GOAL #4   Title Pt will report being able to stand and complete light household activity for up to 7-10 minutes without the need for a rest break.    Time 4    Period Weeks    Status On-going    Target Date 09/20/20      PT LONG TERM GOAL #5   Title perform 5x sit to stand in < or = to 15 seconds without UE support to reduce falls risk    Baseline 17 seconds    Time 4    Period Weeks    Status On-going    Target Date 09/20/20                 Plan - 08/28/20 1239    Clinical Impression Statement Pt reports 10% overall improvement with main compliant is bil leg pain that limits standing.  Pt improved sit to  stand 5x to 17 sec this week. Pt also reports she has less pain in the afternoons/late in the day than prior to coming to PT.  PT educated pt on how to use Addaday so she can manage her leg pain at home.  Pt will continue to benefit from skilled PT to improve strength, balance and address LE and low back pain that limits functional endurance at home and in the community.    Rehab Potential Good    PT Frequency 2x / week    PT Duration 4 weeks    PT Treatment/Interventions ADLs/Self Care Home Management;Cryotherapy;Electrical Stimulation;Moist Heat;Gait training;Stair training;Functional mobility training;Therapeutic activities;Therapeutic exercise;Balance training;Neuromuscular re-education;Manual techniques;Passive range of motion;Dry needling;Taping    PT Next Visit Plan Continue to address LE strength, enduarnce, balance and leg pain to improve safety and independence.    PT Home Exercise Plan Access Code: [email or web address]    Recommended Other Services recert is signed    Consulted and Agree with Plan of Care Patient           Patient will benefit from skilled therapeutic intervention in order to improve the following deficits and impairments:  Abnormal gait, Decreased activity tolerance, Decreased balance, Decreased strength, Postural dysfunction, Improper body mechanics, Impaired flexibility, Pain, Increased muscle spasms, Decreased endurance, Difficulty walking, Decreased range of motion  Visit Diagnosis: Chronic low back pain, unspecified back pain laterality, unspecified whether sciatica present  Muscle weakness (generalized)  Cramp and spasm  Difficulty in walking, not elsewhere classified     Problem List Patient Active Problem List   Diagnosis Date Noted  . Vitamin D deficiency 05/30/2020  . COPD (chronic obstructive pulmonary disease) ([place]) 05/30/2020  . Hyperglycemia 05/30/2020  . CKD (chronic kidney disease) 05/30/2020  . Lumbar spinal stenosis 05/30/2020     Claiborne Billings  Garnetta Buddy, PT 08/28/20 1:11 PM  [facility] [address], [place], [place], [address] Phone: [phone]   Fax:  [phone]  Name: Alison Lewis MRN: [id number] Date of Birth: May 23, 1936

## 2020-09-02 ENCOUNTER — Other Ambulatory Visit: Payer: Self-pay

## 2020-09-02 ENCOUNTER — Ambulatory Visit: Payer: Medicare Other | Admitting: Physical Therapy

## 2020-09-02 ENCOUNTER — Encounter: Payer: Self-pay | Admitting: Physical Therapy

## 2020-09-02 DIAGNOSIS — M6281 Muscle weakness (generalized): Secondary | ICD-10-CM

## 2020-09-02 DIAGNOSIS — G8929 Other chronic pain: Secondary | ICD-10-CM | POA: Diagnosis not present

## 2020-09-02 DIAGNOSIS — R262 Difficulty in walking, not elsewhere classified: Secondary | ICD-10-CM

## 2020-09-02 DIAGNOSIS — M25551 Pain in right hip: Secondary | ICD-10-CM | POA: Diagnosis not present

## 2020-09-02 DIAGNOSIS — M545 Low back pain, unspecified: Secondary | ICD-10-CM

## 2020-09-02 DIAGNOSIS — R252 Cramp and spasm: Secondary | ICD-10-CM | POA: Diagnosis not present

## 2020-09-02 DIAGNOSIS — M25552 Pain in left hip: Secondary | ICD-10-CM

## 2020-09-02 NOTE — Therapy (Signed)
[facility] [address], [place], [place], [address] Phone: [phone]   Fax:  [phone]  Physical Therapy Treatment  Patient Details  Name: Alison Lewis MRN: [id number] Date of Birth: Mar 08, 1936 Referring Provider (PT): Suella Broad, MD   Encounter Date: 09/02/2020   PT End of Session - 09/02/20 1239    Visit Number 12    Date for PT Re-Evaluation 09/20/20    Authorization Type Medicare    Progress Note Due on Visit 20    PT Start Time 1236    PT Stop Time 1310    PT Time Calculation (min) 34 min    Activity Tolerance Patient tolerated treatment well;Patient limited by lethargy    Behavior During Therapy [facility] for tasks assessed/performed           Past Medical History:  Diagnosis Date  . Allergy   . Cholecystitis   . Colon polyps   . Hip bursitis   . Hypertension   . Lumbar spinal stenosis   . Osteopenia   . Shingles   . Sinusitis   . Tobacco abuse     Past Surgical History:  Procedure Laterality Date  . CATARACT EXTRACTION, BILATERAL    . CHOLECYSTECTOMY      There were no vitals filed for this visit.   Subjective Assessment - 09/02/20 1241    Subjective I charged my massager but haven't really used it yet.    Pertinent History lumbar spinal stenosis    Currently in Pain? Yes    Pain Score 3     Pain Location Leg    Pain Orientation Right;Left    Pain Descriptors / Indicators Sore    Aggravating Factors  standing and walking    Pain Relieving Factors sitting    Multiple Pain Sites No                             [facility] Adult PT Treatment/Exercise - 09/02/20 0001      Lumbar Exercises: Standing   Heel Raises 15 reps    Row Strengthening;Both;20 reps;Theraband    Shoulder Extension Strengthening;20 reps;Theraband    Theraband Level (Shoulder Extension) Level 2 (Red)      Knee/Hip Exercises: Aerobic   Nustep L2 x 10 min with PTA present  to monitor pt       Knee/Hip Exercises: Seated   Long Arc Quad Strengthening;Both;1 set;15 reps;Weights    Long Arc Quad Weight 3 lbs.    Ball Squeeze 5" hold x20    Clamshell with TheraBand --   red loop 2x15   Marching Strengthening;Both;15 reps;Weights;2 sets    Marching Limitations 3      Manual Therapy   Manual Therapy Soft tissue mobilization    Soft tissue mobilization Addaday assist bil quads and lateral quads 5 min each                    PT Short Term Goals - 08/26/20 1234      PT SHORT TERM GOAL #1   Title Pt will demo consistency and independence with her initial HEP to decrease pain and improve LE strength.    Period Weeks    Status Achieved    Target Date 07/29/20      PT SHORT TERM GOAL #2   Title report a 25% reduction in bil LE pain with standing    Baseline 10% better  Time 4    Period Weeks    Status On-going   10%   Target Date 07/29/20             PT Long Term Goals - 08/26/20 1707      PT LONG TERM GOAL #1   Title be independent in advanced HEP    Time 4    Period Weeks    Status On-going    Target Date 09/20/20      PT LONG TERM GOAL #2   Title verbalize and demonstrate body mechanics modifications for lumbar protection with ADLs and self-care at home    Status Achieved      PT LONG TERM GOAL #3   Title report a 50% reduction in bil LE pain with standing for ADLs and self-care    Time 4    Period Weeks    Status On-going    Target Date 09/20/20      PT LONG TERM GOAL #4   Title Pt will report being able to stand and complete light household activity for up to 7-10 minutes without the need for a rest break.    Time 4    Period Weeks    Status On-going    Target Date 09/20/20      PT LONG TERM GOAL #5   Title perform 5x sit to stand in < or = to 15 seconds without UE support to reduce falls risk    Baseline 17 seconds    Time 4    Period Weeks    Status On-going    Target Date 09/20/20                 Plan - 09/02/20 1240     Clinical Impression Statement Pt tolerates increased reps with her Pt session today, feels her current resistance levels are 'just right." Pain in legs holding steady since last session.    Personal Factors and Comorbidities Age;Comorbidity 1    Comorbidities stenosis, cares for husband    Examination-Activity Limitations Stand;Stairs;Locomotion Level;Transfers    Examination-Participation Restrictions Church;Cleaning;Community Activity;Laundry;Shop;Meal Prep    Stability/Clinical Decision Making Evolving/Moderate complexity    Rehab Potential Good    PT Frequency 2x / week    PT Duration 4 weeks    PT Treatment/Interventions ADLs/Self Care Home Management;Cryotherapy;Electrical Stimulation;Moist Heat;Gait training;Stair training;Functional mobility training;Therapeutic activities;Therapeutic exercise;Balance training;Neuromuscular re-education;Manual techniques;Passive range of motion;Dry needling;Taping    PT Next Visit Plan Continue to address LE strength, enduarnce, balance and leg pain to improve safety and independence.    PT Home Exercise Plan Access Code: [email or web address]    Consulted and Agree with Plan of Care Patient           Patient will benefit from skilled therapeutic intervention in order to improve the following deficits and impairments:  Abnormal gait,Decreased activity tolerance,Decreased balance,Decreased strength,Postural dysfunction,Improper body mechanics,Impaired flexibility,Pain,Increased muscle spasms,Decreased endurance,Difficulty walking,Decreased range of motion  Visit Diagnosis: Chronic low back pain, unspecified back pain laterality, unspecified whether sciatica present  Muscle weakness (generalized)  Cramp and spasm  Difficulty in walking, not elsewhere classified  Pain in right hip  Pain in left hip     Problem List Patient Active Problem List   Diagnosis Date Noted  . Vitamin D deficiency 05/30/2020  . COPD (chronic obstructive pulmonary disease)  ([place]) 05/30/2020  . Hyperglycemia 05/30/2020  . CKD (chronic kidney disease) 05/30/2020  . Lumbar spinal stenosis 05/30/2020    Chaniah Cisse, PTA 09/02/2020, 1:13 PM  [facility] [address] [place], [place], [address] Phone: [phone]   Fax:  [phone]  Name: LAKYNN HALVORSEN MRN: [id number] Date of Birth: 17-Jun-1936

## 2020-09-04 ENCOUNTER — Other Ambulatory Visit: Payer: Self-pay

## 2020-09-04 ENCOUNTER — Ambulatory Visit: Payer: Medicare Other

## 2020-09-04 DIAGNOSIS — R252 Cramp and spasm: Secondary | ICD-10-CM | POA: Diagnosis not present

## 2020-09-04 DIAGNOSIS — M545 Low back pain, unspecified: Secondary | ICD-10-CM

## 2020-09-04 DIAGNOSIS — G8929 Other chronic pain: Secondary | ICD-10-CM | POA: Diagnosis not present

## 2020-09-04 DIAGNOSIS — R262 Difficulty in walking, not elsewhere classified: Secondary | ICD-10-CM

## 2020-09-04 DIAGNOSIS — M6281 Muscle weakness (generalized): Secondary | ICD-10-CM

## 2020-09-04 DIAGNOSIS — M25552 Pain in left hip: Secondary | ICD-10-CM

## 2020-09-04 DIAGNOSIS — M25551 Pain in right hip: Secondary | ICD-10-CM

## 2020-09-04 NOTE — Therapy (Signed)
[facility] [address] [place], [place], [address] Phone: [phone]   Fax:  [phone]  Physical Therapy Treatment  Patient Details  Name: Alison Lewis MRN: [id number] Date of Birth: Dec 09, 1935 Referring Provider (PT): Suella Broad, MD   Encounter Date: 09/04/2020   PT End of Session - 09/04/20 1316    Visit Number 13    Date for PT Re-Evaluation 09/20/20    Authorization Type Medicare    Progress Note Due on Visit 20    PT Start Time 1231    PT Stop Time 1314    PT Time Calculation (min) 43 min    Activity Tolerance Patient tolerated treatment well    Behavior During Therapy [facility] for tasks assessed/performed           Past Medical History:  Diagnosis Date  . Allergy   . Cholecystitis   . Colon polyps   . Hip bursitis   . Hypertension   . Lumbar spinal stenosis   . Osteopenia   . Shingles   . Sinusitis   . Tobacco abuse     Past Surgical History:  Procedure Laterality Date  . CATARACT EXTRACTION, BILATERAL    . CHOLECYSTECTOMY      There were no vitals filed for this visit.   Subjective Assessment - 09/04/20 1243    Subjective My pain is in my legs. I have used my massager on my legs once.    Currently in Pain? Yes    Pain Score 2     Pain Location Leg    Pain Orientation Right;Left    Pain Descriptors / Indicators Sore    Pain Type Chronic pain    Pain Onset More than a month ago    Pain Frequency Intermittent    Aggravating Factors  standing and walking    Pain Relieving Factors sitting                             [facility] Adult PT Treatment/Exercise - 09/04/20 0001      Lumbar Exercises: Standing   Heel Raises 15 reps    Row Strengthening;Both;20 reps;Theraband    Shoulder Extension Strengthening;20 reps;Theraband    Theraband Level (Shoulder Extension) Level 2 (Red)      Knee/Hip Exercises: Aerobic   Nustep L2 x 10 min with PT present  to monitor pt       Knee/Hip Exercises: Seated   Long Arc Quad Strengthening;Both;1 set;15 reps;Weights    Long Arc Quad Weight 3 lbs.    Ball Squeeze 5" hold x20    Clamshell with TheraBand --   red loop 2x15   Marching Strengthening;Both;15 reps;Weights;2 sets    Marching Limitations 3      Manual Therapy   Manual Therapy Soft tissue mobilization    Soft tissue mobilization Addaday assist bil quads and lateral quads 5 min each                    PT Short Term Goals - 08/26/20 1234      PT SHORT TERM GOAL #1   Title Pt will demo consistency and independence with her initial HEP to decrease pain and improve LE strength.    Period Weeks    Status Achieved    Target Date 07/29/20      PT SHORT TERM GOAL #2   Title report a 25% reduction in bil LE pain with  standing    Baseline 10% better    Time 4    Period Weeks    Status On-going   10%   Target Date 07/29/20             PT Long Term Goals - 08/26/20 1707      PT LONG TERM GOAL #1   Title be independent in advanced HEP    Time 4    Period Weeks    Status On-going    Target Date 09/20/20      PT LONG TERM GOAL #2   Title verbalize and demonstrate body mechanics modifications for lumbar protection with ADLs and self-care at home    Status Achieved      PT LONG TERM GOAL #3   Title report a 50% reduction in bil LE pain with standing for ADLs and self-care    Time 4    Period Weeks    Status On-going    Target Date 09/20/20      PT LONG TERM GOAL #4   Title Pt will report being able to stand and complete light household activity for up to 7-10 minutes without the need for a rest break.    Time 4    Period Weeks    Status On-going    Target Date 09/20/20      PT LONG TERM GOAL #5   Title perform 5x sit to stand in < or = to 15 seconds without UE support to reduce falls risk    Baseline 17 seconds    Time 4    Period Weeks    Status On-going    Target Date 09/20/20                 Plan - 09/04/20  1254    Clinical Impression Statement Pt tolerates increased reps with her PT session today.  Pt is challenged by her current level of resistance. Pt had to stop at 7 minutes on NuStep at begining of session due to LE pain.  Pt reports that she is able to stand longer for home tasks and has been able to walk in the grocery store for a couple of trips over the past few weeks. Pain in legs holding steady since last session.  PT monitored pt closely throughout session for pain and to provide verbal cues for technique.  Pt will continue to benefit from skilled PT to improve functional strength and endurance and reduce LE pain to allow for improved tolerance for standing activity.    Examination-Activity Limitations Stand;Stairs;Locomotion Level;Transfers;Sit    Clinical Decision Making Moderate    Rehab Potential Good    PT Frequency 2x / week    PT Duration 4 weeks    PT Treatment/Interventions ADLs/Self Care Home Management;Cryotherapy;Electrical Stimulation;Moist Heat;Gait training;Stair training;Functional mobility training;Therapeutic activities;Therapeutic exercise;Balance training;Neuromuscular re-education;Manual techniques;Passive range of motion;Dry needling;Taping;Patient/family education    PT Next Visit Plan Continue LE strength, pain management/manual for leg pain, endurance    PT Home Exercise Plan Access Code: [email or web address]    Consulted and Agree with Plan of Care Patient           Patient will benefit from skilled therapeutic intervention in order to improve the following deficits and impairments:  Abnormal gait,Decreased activity tolerance,Decreased balance,Decreased strength,Postural dysfunction,Improper body mechanics,Impaired flexibility,Pain,Increased muscle spasms,Decreased endurance,Difficulty walking,Decreased range of motion  Visit Diagnosis: Muscle weakness (generalized)  Chronic low back pain, unspecified back pain laterality, unspecified whether sciatica present  Cramp and  spasm  Difficulty  in walking, not elsewhere classified  Pain in right hip  Pain in left hip     Problem List Patient Active Problem List   Diagnosis Date Noted  . Vitamin D deficiency 05/30/2020  . COPD (chronic obstructive pulmonary disease) ([place]) 05/30/2020  . Hyperglycemia 05/30/2020  . CKD (chronic kidney disease) 05/30/2020  . Lumbar spinal stenosis 05/30/2020    Sigurd Sos, PT 09/04/20 1:17 PM  [facility] [address] [place], [place], [address] Phone: [phone]   Fax:  [phone]  Name: SADA MAZZONI MRN: [id number] Date of Birth: 09-22-1935

## 2020-09-09 ENCOUNTER — Ambulatory Visit: Payer: Medicare Other | Admitting: Physical Therapy

## 2020-09-09 ENCOUNTER — Encounter: Payer: Self-pay | Admitting: Physical Therapy

## 2020-09-09 ENCOUNTER — Other Ambulatory Visit: Payer: Self-pay

## 2020-09-09 DIAGNOSIS — R262 Difficulty in walking, not elsewhere classified: Secondary | ICD-10-CM | POA: Diagnosis not present

## 2020-09-09 DIAGNOSIS — R252 Cramp and spasm: Secondary | ICD-10-CM

## 2020-09-09 DIAGNOSIS — M545 Low back pain, unspecified: Secondary | ICD-10-CM

## 2020-09-09 DIAGNOSIS — G8929 Other chronic pain: Secondary | ICD-10-CM | POA: Diagnosis not present

## 2020-09-09 DIAGNOSIS — M6281 Muscle weakness (generalized): Secondary | ICD-10-CM

## 2020-09-09 DIAGNOSIS — M25551 Pain in right hip: Secondary | ICD-10-CM | POA: Diagnosis not present

## 2020-09-09 NOTE — Therapy (Signed)
[facility] [address], [place], [place], [address] Phone: [phone]   Fax:  [phone]  Physical Therapy Treatment  Patient Details  Name: Alison Lewis MRN: [id number] Date of Birth: [birth year] Referring Provider (PT): Suella Broad, MD   Encounter Date: 09/09/2020   PT End of Session - 09/09/20 1233    Visit Number 14    Date for PT Re-Evaluation 09/20/20    Authorization Type Medicare    Progress Note Due on Visit 20    PT Start Time 1233    PT Stop Time 1312    PT Time Calculation (min) 39 min    Activity Tolerance Patient tolerated treatment well    Behavior During Therapy [facility] for tasks assessed/performed           Past Medical History:  Diagnosis Date  . Allergy   . Cholecystitis   . Colon polyps   . Hip bursitis   . Hypertension   . Lumbar spinal stenosis   . Osteopenia   . Shingles   . Sinusitis   . Tobacco abuse     Past Surgical History:  Procedure Laterality Date  . CATARACT EXTRACTION, BILATERAL    . CHOLECYSTECTOMY      There were no vitals filed for this visit.   Subjective Assessment - 09/09/20 1234    Subjective About the same.    Pertinent History lumbar spinal stenosis    Currently in Pain? Yes    Pain Location Leg    Pain Orientation Right;Left    Pain Descriptors / Indicators Sore    Aggravating Factors  walking    Pain Relieving Factors sitting    Multiple Pain Sites No                             [facility] Adult PT Treatment/Exercise - 09/09/20 0001      Knee/Hip Exercises: Aerobic   Nustep L2 x 10 min with PT present  to monitor pt      Knee/Hip Exercises: Standing   Hip Abduction AROM;Stengthening;Both;1 set;10 reps      Knee/Hip Exercises: Seated   Long Arc Quad Strengthening;Both;1 set;15 reps;Weights    Long Arc Quad Weight 3 lbs.    Ball Squeeze 5" hold x20    Clamshell with TheraBand --   red loop 2x15   Marching  Strengthening;Both;15 reps;Weights;2 sets    Marching Limitations 3    Sit to Sand 1 set;10 reps;without UE support   holding 5#                   PT Short Term Goals - 08/26/20 1234      PT SHORT TERM GOAL #1   Title Pt will demo consistency and independence with her initial HEP to decrease pain and improve LE strength.    Period Weeks    Status Achieved    Target Date 07/29/20      PT SHORT TERM GOAL #2   Title report a 25% reduction in bil LE pain with standing    Baseline 10% better    Time 4    Period Weeks    Status On-going   10%   Target Date 07/29/20             PT Long Term Goals - 08/26/20 1707      PT LONG TERM GOAL #1   Title be independent in advanced HEP  Time 4    Period Weeks    Status On-going    Target Date 09/20/20      PT LONG TERM GOAL #2   Title verbalize and demonstrate body mechanics modifications for lumbar protection with ADLs and self-care at home    Status Achieved      PT LONG TERM GOAL #3   Title report a 50% reduction in bil LE pain with standing for ADLs and self-care    Time 4    Period Weeks    Status On-going    Target Date 09/20/20      PT LONG TERM GOAL #4   Title Pt will report being able to stand and complete light household activity for up to 7-10 minutes without the need for a rest break.    Time 4    Period Weeks    Status On-going    Target Date 09/20/20      PT LONG TERM GOAL #5   Title perform 5x sit to stand in < or = to 15 seconds without UE support to reduce falls risk    Baseline 17 seconds    Time 4    Period Weeks    Status On-going    Target Date 09/20/20                 Plan - 09/09/20 1234    Clinical Impression Statement About the same report as last time from patient. pt not interested in increasing much of her session secondary to holidays but she did perform standing hip abduction with light ankle weights. We attempted sit to stand holding 5# ketle bell ( pt was reluctant)  and this increased leg pain down past her knees. Exercise was discontinued with kettle bell.    Personal Factors and Comorbidities Age;Comorbidity 1    Comorbidities stenosis, cares for husband    Examination-Activity Limitations Stand;Stairs;Locomotion Level;Transfers;Sit    Examination-Participation Restrictions Community Activity;Laundry;Shop;Meal Prep    Stability/Clinical Decision Making Evolving/Moderate complexity    Rehab Potential Good    PT Frequency 2x / week    PT Duration 4 weeks    PT Treatment/Interventions ADLs/Self Care Home Management;Cryotherapy;Electrical Stimulation;Moist Heat;Gait training;Stair training;Functional mobility training;Therapeutic activities;Therapeutic exercise;Balance training;Neuromuscular re-education;Manual techniques;Passive range of motion;Dry needling;Taping;Patient/family education    PT Next Visit Plan Continue LE strength, pain management/manual for leg pain, endurance    PT Home Exercise Plan Access Code: [email or web address]    Consulted and Agree with Plan of Care Patient           Patient will benefit from skilled therapeutic intervention in order to improve the following deficits and impairments:  Abnormal gait,Decreased activity tolerance,Decreased balance,Decreased strength,Postural dysfunction,Improper body mechanics,Impaired flexibility,Pain,Increased muscle spasms,Decreased endurance,Difficulty walking,Decreased range of motion  Visit Diagnosis: Chronic low back pain, unspecified back pain laterality, unspecified whether sciatica present  Muscle weakness (generalized)  Cramp and spasm  Difficulty in walking, not elsewhere classified     Problem List Patient Active Problem List   Diagnosis Date Noted  . Vitamin D deficiency 05/30/2020  . COPD (chronic obstructive pulmonary disease) ([place]) 05/30/2020  . Hyperglycemia 05/30/2020  . CKD (chronic kidney disease) 05/30/2020  . Lumbar spinal stenosis 05/30/2020    Alison Lewis,  {PTA 09/09/2020, 3:34 PM  [facility] [address] [place], [place], [address] Phone: [phone]   Fax:  [phone]  Name: Alison Lewis MRN: [id number] Date of Birth: 1936-06-14

## 2020-09-16 ENCOUNTER — Other Ambulatory Visit: Payer: Self-pay

## 2020-09-16 ENCOUNTER — Ambulatory Visit: Payer: Medicare Other | Admitting: Physical Therapy

## 2020-09-16 ENCOUNTER — Encounter: Payer: Self-pay | Admitting: Physical Therapy

## 2020-09-16 DIAGNOSIS — M6281 Muscle weakness (generalized): Secondary | ICD-10-CM | POA: Diagnosis not present

## 2020-09-16 DIAGNOSIS — R252 Cramp and spasm: Secondary | ICD-10-CM | POA: Diagnosis not present

## 2020-09-16 DIAGNOSIS — M545 Low back pain, unspecified: Secondary | ICD-10-CM

## 2020-09-16 DIAGNOSIS — M25551 Pain in right hip: Secondary | ICD-10-CM | POA: Diagnosis not present

## 2020-09-16 DIAGNOSIS — R262 Difficulty in walking, not elsewhere classified: Secondary | ICD-10-CM

## 2020-09-16 DIAGNOSIS — G8929 Other chronic pain: Secondary | ICD-10-CM | POA: Diagnosis not present

## 2020-09-16 NOTE — Therapy (Signed)
[facility] [address], [address] [place], [place], [address] Phone: [phone]   Fax:  [phone]  Physical Therapy Treatment  Patient Details  Name: Alison Lewis MRN: [id number] Date of Birth: 01/01/1936 Referring Provider (PT): Sheran Luz, MD   Encounter Date: 09/16/2020   PT End of Session - 09/16/20 1233    Visit Number 15    Date for PT Re-Evaluation 09/20/20    Authorization Type Medicare    Progress Note Due on Visit 20    PT Start Time 1231    PT Stop Time 1306    PT Time Calculation (min) 35 min    Activity Tolerance Patient tolerated treatment well    Behavior During Therapy [facility] for tasks assessed/performed           Past Medical History:  Diagnosis Date  . Allergy   . Cholecystitis   . Colon polyps   . Hip bursitis   . Hypertension   . Lumbar spinal stenosis   . Osteopenia   . Shingles   . Sinusitis   . Tobacco abuse     Past Surgical History:  Procedure Laterality Date  . CATARACT EXTRACTION, BILATERAL    . CHOLECYSTECTOMY      There were no vitals filed for this visit.   Subjective Assessment - 09/16/20 1235    Subjective Pain about the same.    Pertinent History lumbar spinal stenosis    Limitations Standing;Walking    Diagnostic tests none recent.  Old MRI- pt reports spinal stenosis.    Currently in Pain? Yes    Pain Score 2     Pain Location Leg    Pain Orientation Right    Pain Descriptors / Indicators Sore    Aggravating Factors  walking, standing    Pain Relieving Factors sitting down    Multiple Pain Sites No                             [facility] Adult PT Treatment/Exercise - 09/16/20 0001      Knee/Hip Exercises: Aerobic   Nustep L2 x 10 min with PT present  to monitor pt      Knee/Hip Exercises: Standing   Hip Abduction AROM;Stengthening;Both;1 set;10 reps    Abduction Limitations 1.5#    Hip Extension Stengthening;Both;1 set;10 reps;Knee  straight    Extension Limitations 1.5#      Knee/Hip Exercises: Seated   Long Arc Quad Strengthening;Both;2 sets;15 reps;Weights    Long Arc Quad Weight 3 lbs.   second set was only 10 reps   [employer] 5" hold x20    Clamshell with TheraBand --   red loop 2x15   Marching Strengthening;Both;1 set;20 reps;Weights    Marching Limitations 3    Sit to Sand 1 set;10 reps;without UE support   holding 5#     Manual Therapy   Manual Therapy Soft tissue mobilization    Soft tissue mobilization Addaday assist bil quads and lateral quads 5 min each                    PT Short Term Goals - 08/26/20 1234      PT SHORT TERM GOAL #1   Title Pt will demo consistency and independence with her initial HEP to decrease pain and improve LE strength.    Period Weeks    Status Achieved    Target Date 07/29/20  PT SHORT TERM GOAL #2   Title report a 25% reduction in bil LE pain with standing    Baseline 10% better    Time 4    Period Weeks    Status On-going   10%   Target Date 07/29/20             PT Long Term Goals - 08/26/20 1707      PT LONG TERM GOAL #1   Title be independent in advanced HEP    Time 4    Period Weeks    Status On-going    Target Date 09/20/20      PT LONG TERM GOAL #2   Title verbalize and demonstrate body mechanics modifications for lumbar protection with ADLs and self-care at home    Status Achieved      PT LONG TERM GOAL #3   Title report a 50% reduction in bil LE pain with standing for ADLs and self-care    Time 4    Period Weeks    Status On-going    Target Date 09/20/20      PT LONG TERM GOAL #4   Title Pt will report being able to stand and complete light household activity for up to 7-10 minutes without the need for a rest break.    Time 4    Period Weeks    Status On-going    Target Date 09/20/20      PT LONG TERM GOAL #5   Title perform 5x sit to stand in < or = to 15 seconds without UE support to reduce falls risk     Baseline 17 seconds    Time 4    Period Weeks    Status On-going    Target Date 09/20/20                 Plan - 09/16/20 1234    Clinical Impression Statement Pt added resistance to standing exercises today. Pt reports this makes her legs hurt but the Addaday work after makes them feel better.    Personal Factors and Comorbidities Age;Comorbidity 1    Comorbidities stenosis, cares for husband    Examination-Activity Limitations Stand;Stairs;Locomotion Level;Transfers;Sit    Examination-Participation Restrictions Community Activity;Laundry;Shop;Meal Prep    Stability/Clinical Decision Making Evolving/Moderate complexity    Rehab Potential Good    PT Frequency 2x / week    PT Duration 4 weeks    PT Treatment/Interventions ADLs/Self Care Home Management;Cryotherapy;Electrical Stimulation;Moist Heat;Gait training;Stair training;Functional mobility training;Therapeutic activities;Therapeutic exercise;Balance training;Neuromuscular re-education;Manual techniques;Passive range of motion;Dry needling;Taping;Patient/family education    PT Next Visit Plan FOTO, 10th visit next    PT Home Exercise Plan Access Code: [email or web address]    Consulted and Agree with Plan of Care Patient           Patient will benefit from skilled therapeutic intervention in order to improve the following deficits and impairments:  Abnormal gait,Decreased activity tolerance,Decreased balance,Decreased strength,Postural dysfunction,Improper body mechanics,Impaired flexibility,Pain,Increased muscle spasms,Decreased endurance,Difficulty walking,Decreased range of motion  Visit Diagnosis: Chronic low back pain, unspecified back pain laterality, unspecified whether sciatica present  Muscle weakness (generalized)  Cramp and spasm  Difficulty in walking, not elsewhere classified     Problem List Patient Active Problem List   Diagnosis Date Noted  . Vitamin D deficiency 05/30/2020  . COPD (chronic obstructive  pulmonary disease) ([facility]) 05/30/2020  . Hyperglycemia 05/30/2020  . CKD (chronic kidney disease) 05/30/2020  . Lumbar spinal stenosis 05/30/2020    Markeeta Scalf, PTA 09/16/2020, 1:08  PM  [facility] [address], [place], [place], [address] Phone: [phone]   Fax:  [phone]  Name: KASSITY CROUSHORE MRN: [id number] Date of Birth: 1936/04/18

## 2020-09-18 ENCOUNTER — Other Ambulatory Visit: Payer: Self-pay

## 2020-09-18 ENCOUNTER — Ambulatory Visit: Payer: Medicare Other | Admitting: Physical Therapy

## 2020-09-18 ENCOUNTER — Encounter: Payer: Self-pay | Admitting: Physical Therapy

## 2020-09-18 DIAGNOSIS — M545 Low back pain, unspecified: Secondary | ICD-10-CM

## 2020-09-18 DIAGNOSIS — R252 Cramp and spasm: Secondary | ICD-10-CM | POA: Diagnosis not present

## 2020-09-18 DIAGNOSIS — M25551 Pain in right hip: Secondary | ICD-10-CM | POA: Diagnosis not present

## 2020-09-18 DIAGNOSIS — M6281 Muscle weakness (generalized): Secondary | ICD-10-CM

## 2020-09-18 DIAGNOSIS — R262 Difficulty in walking, not elsewhere classified: Secondary | ICD-10-CM | POA: Diagnosis not present

## 2020-09-18 DIAGNOSIS — G8929 Other chronic pain: Secondary | ICD-10-CM | POA: Diagnosis not present

## 2020-09-18 NOTE — Therapy (Addendum)
[facility] [address] [place], [place], [address] Phone: [phone]   Fax:  [phone]  Physical Therapy Treatment  Patient Details  Name: Alison Lewis MRN: [id number] Date of Birth: 1936/07/16 Referring Provider (PT): Suella Broad, MD   Encounter Date: 09/18/2020   PT End of Session - 09/18/20 1237    Visit Number 16    Date for PT Re-Evaluation 09/20/20    Authorization Type Medicare    Progress Note Due on Visit 20    PT Start Time 3354    PT Stop Time 1321    PT Time Calculation (min) 46 min    Activity Tolerance Patient tolerated treatment well    Behavior During Therapy [facility] for tasks assessed/performed           Past Medical History:  Diagnosis Date  . Allergy   . Cholecystitis   . Colon polyps   . Hip bursitis   . Hypertension   . Lumbar spinal stenosis   . Osteopenia   . Shingles   . Sinusitis   . Tobacco abuse     Past Surgical History:  Procedure Laterality Date  . CATARACT EXTRACTION, BILATERAL    . CHOLECYSTECTOMY      There were no vitals filed for this visit.   Subjective Assessment - 09/18/20 1239    Subjective I am moving slow today.    Pertinent History lumbar spinal stenosis    Currently in Pain? No/denies    Pain Score 2     Pain Location Leg    Pain Orientation Right;Left    Pain Descriptors / Indicators Sore    Aggravating Factors  walking standing    Pain Relieving Factors sitting    Multiple Pain Sites No              [facility] PT Assessment - 09/18/20 0001      Observation/Other Assessments   Focus on Therapeutic Outcomes (FOTO)  56% limited      Strength   Overall Strength Comments Bil ankle DF 4/5, Lt quad 4/5, RT 4-/5, Bil hip flexor 4-/5, RT hip ER 4/5, LT 4-/5       Transfers   Five time sit to stand comments  20 sec with leg pain. Hands on chair                                   PT Short Term Goals - 09/18/20  1241      PT SHORT TERM GOAL #2   Title report a 25% reduction in bil LE pain with standing    Time 4    Period Weeks    Status On-going   15%     PT SHORT TERM GOAL #3   Title perform 5x sit to stand in < or = to 19 seconds (with hands) to reduce falls risk    Time 4    Period Weeks    Status On-going             PT Long Term Goals - 09/18/20 1252      PT LONG TERM GOAL #1   Title be independent in advanced HEP    Time 4    Period Weeks    Status Achieved      PT LONG TERM GOAL #2   Title verbalize and demonstrate body mechanics modifications for lumbar protection with ADLs and  self-care at home    Period Weeks    Status Achieved      PT LONG TERM GOAL #3   Title report a 50% reduction in bil LE pain with standing for ADLs and self-care    Time 4    Period Weeks    Status Not Met   15%     PT LONG TERM GOAL #4   Title Pt will report being able to stand and complete light household activity for up to 7-10 minutes without the need for a rest break.    Time 4    Period Weeks    Status Achieved      PT LONG TERM GOAL #5   Title perform 5x sit to stand in < or = to 15 seconds without UE support to reduce falls risk    Time 4    Period Weeks    Status Not Met                 Plan - 09/18/20 1237    Clinical Impression Statement Pt reports leg pain about 15% improved since initial evaluation. She is semi-compliant with HEP and reports she "can do better with this at home." MMT, FOTO, and 5x sit to stand all about the same scores as last measurements. PTA educated pt how to use theraband at home so she can continue with her LE resistance without having to buy anlkle weights. Pt was able to demonstrate all with good form. Only standing hip abduction she may have to do actively for a few more weeks as this can increase leg pain. Pt understood this. Pt can stand 10 min on average to perfrorm light household activites, this meets long term goal. Sitting down continues  to abolish her leg pain.    Personal Factors and Comorbidities Age;Comorbidity 1    Comorbidities stenosis, cares for husband    Examination-Activity Limitations Stand;Stairs;Locomotion Level;Transfers;Sit    Examination-Participation Restrictions Community Activity;Laundry;Shop;Meal Prep    Stability/Clinical Decision Making Evolving/Moderate complexity    Rehab Potential Good    PT Frequency 2x / week    PT Duration 4 weeks    PT Treatment/Interventions ADLs/Self Care Home Management;Cryotherapy;Electrical Stimulation;Moist Heat;Gait training;Stair training;Functional mobility training;Therapeutic activities;Therapeutic exercise;Balance training;Neuromuscular re-education;Manual techniques;Passive range of motion;Dry needling;Taping;Patient/family education    PT Next Visit Plan Discharge no progress towards goals.    PT Home Exercise Plan Access Code: [email or web address]    Consulted and Agree with Plan of Care Patient           Patient will benefit from skilled therapeutic intervention in order to improve the following deficits and impairments:  Abnormal gait,Decreased activity tolerance,Decreased balance,Decreased strength,Postural dysfunction,Improper body mechanics,Impaired flexibility,Pain,Increased muscle spasms,Decreased endurance,Difficulty walking,Decreased range of motion  Visit Diagnosis: Chronic low back pain, unspecified back pain laterality, unspecified whether sciatica present  Muscle weakness (generalized)  Cramp and spasm  Difficulty in walking, not elsewhere classified     Problem List Patient Active Problem List   Diagnosis Date Noted  . Vitamin D deficiency 05/30/2020  . COPD (chronic obstructive pulmonary disease) ([place]) 05/30/2020  . Hyperglycemia 05/30/2020  . CKD (chronic kidney disease) 05/30/2020  . Lumbar spinal stenosis 05/30/2020    Myrene Galas, PTA 09/18/20 2:09 PM  09/18/2020, 2:09 PM PHYSICAL THERAPY DISCHARGE SUMMARY  Visits from Start of  Care: 16  Current functional level related to goals / functional outcomes: See above for current PT status.     Remaining deficits: Bil LE pain and limited strength, endurance  and safety at home and in the community.  Pt has HEP in place for advancement of strength, endurance and balance.     Education / Equipment: HEP, posture/body mechanics Plan: Patient agrees to discharge.  Patient goals were partially met. Patient is being discharged due to being pleased with the current functional level.  ?????        Sigurd Sos, PT 09/19/20 7:23 AM    [facility] [address], [place], [place], [address] Phone: [phone]   Fax:  [phone]  Name: Alison Lewis MRN: [id number] Date of Birth: 26-Feb-1936  Access Code: [email or web address]: [email or web address]: 12/29/2021Prepared by: Anderson Malta CochranExercises  Hooklying Single Knee to Chest Stretch - 2 x daily - 7 x weekly - 1 sets - 3 reps - 20 hold  Seated Hamstring Stretch - 3 x daily - 7 x weekly - 1 sets - 3 reps - 20 hold  Seated Long Arc Quad - 3 x daily - 7 x weekly - 1 sets - 10 reps - 5 hold  Heel rises with counter support - 2 x daily - 7 x weekly - 1 sets - 10 reps  Seated March with Resistance - 1 x daily - 7 x weekly - 2 sets - 10 reps  Seated Knee Extension with Resistance - 1 x daily - 7 x weekly - 2 sets - 10 reps  Seated Hip Abduction with Resistance - 1 x daily - 7 x weekly - 2 sets - 10 reps  Standing Hip Abduction with Counter Support - 1 x daily - 7 x weekly - 1 sets - 10 reps

## 2020-11-11 ENCOUNTER — Encounter: Payer: Self-pay | Admitting: Internal Medicine

## 2020-11-25 ENCOUNTER — Telehealth: Payer: Self-pay | Admitting: Nurse Practitioner

## 2020-11-25 NOTE — Telephone Encounter (Signed)
Left vm to call in and schedule AWV.

## 2020-11-28 ENCOUNTER — Other Ambulatory Visit: Payer: Self-pay

## 2020-11-28 ENCOUNTER — Ambulatory Visit (INDEPENDENT_AMBULATORY_CARE_PROVIDER_SITE_OTHER): Payer: Medicare Other | Admitting: Internal Medicine

## 2020-11-28 ENCOUNTER — Encounter: Payer: Self-pay | Admitting: Internal Medicine

## 2020-11-28 VITALS — BP 126/78 | HR 93 | Temp 96.8°F | Ht 66.0 in | Wt 187.6 lb

## 2020-11-28 DIAGNOSIS — Z23 Encounter for immunization: Secondary | ICD-10-CM

## 2020-11-28 DIAGNOSIS — M48061 Spinal stenosis, lumbar region without neurogenic claudication: Secondary | ICD-10-CM | POA: Diagnosis not present

## 2020-11-28 DIAGNOSIS — J449 Chronic obstructive pulmonary disease, unspecified: Secondary | ICD-10-CM | POA: Diagnosis not present

## 2020-11-28 DIAGNOSIS — E7800 Pure hypercholesterolemia, unspecified: Secondary | ICD-10-CM

## 2020-11-28 DIAGNOSIS — N189 Chronic kidney disease, unspecified: Secondary | ICD-10-CM

## 2020-11-28 DIAGNOSIS — R739 Hyperglycemia, unspecified: Secondary | ICD-10-CM | POA: Diagnosis not present

## 2020-11-28 NOTE — Patient Instructions (Addendum)
Recommend you get your mammogram and bone density both done at [facility]:   Located in: [facility] Address: [address] [place], [place], [place] [address] Phone: [phone]

## 2020-11-28 NOTE — Progress Notes (Signed)
Location:  [facility] clinic Provider:  Shanell Aden L. [name], D.O., C.M.D.  Goals of Care:  Advanced Directives 11/28/2020  Does Patient Have a Medical Advance Directive? Yes  Type of Paramedic of [place];Living will  Does patient want to make changes to medical advance directive? No - Patient declined  Copy of [place] in Chart? Yes - validated most recent copy scanned in chart (See row information)  Pre-existing out of facility DNR order (yellow form or pink MOST form) -     Chief Complaint  Patient presents with  . Medical Management of Chronic Issues    6 month follow-up and discuss need for DEXA, PNA, and TD/tdap.    HPI: Patient is a 85 y.o. female seen today for medical management of chronic diseases.    Says nothing is new since she was here.  She and her husband have only been out once for dinner.  They have plans to go out again next week for her bday.  She has no breathing complaints.    Legs bother her ongoing.  She's living with it.  She uses naproxen for pain.    Tries to exercise some--stretches she got from PT before.   Cholesterol was elevated in September and her blood sugar was in the prediabetes range.   Past Medical History:  Diagnosis Date  . Allergy   . Cholecystitis   . Colon polyps   . Hip bursitis   . Hypertension   . Lumbar spinal stenosis   . Osteopenia   . Shingles   . Sinusitis   . Tobacco abuse     Past Surgical History:  Procedure Laterality Date  . CATARACT EXTRACTION, BILATERAL    . CHOLECYSTECTOMY      No Known Allergies  Outpatient Encounter Medications as of 11/28/2020  Medication Sig  . CALCIUM PO Take by mouth daily.  Marland Kitchen DOXYCYCLINE PO Take 20 g by mouth as directed. Prior to dental procedures  . naproxen sodium (ALEVE) 220 MG tablet Take 220 mg by mouth daily.  . NON FORMULARY Vitamin daily  . [DISCONTINUED] aspirin EC 81 MG tablet Take 81 mg by mouth daily.   No facility-administered  encounter medications on file as of 11/28/2020.    Review of Systems:  Review of Systems  Constitutional: Negative for chills, fever and malaise/fatigue.  HENT: Negative for congestion and sore throat.   Eyes: Negative for blurred vision.  Respiratory: Negative for cough and shortness of breath.        Ongoing smoking  Cardiovascular: Negative for chest pain, palpitations and leg swelling.  Gastrointestinal: Negative for abdominal pain and constipation.  Genitourinary: Negative for dysuria.  Musculoskeletal: Negative for falls.  Skin: Negative for itching and rash.  Neurological: Negative for dizziness and loss of consciousness.       Leg pain from back  Endo/Heme/Allergies: Bruises/bleeds easily.  Psychiatric/Behavioral: Negative for memory loss.    Health Maintenance  Topic Date Due  . TETANUS/TDAP  Never done  . DEXA SCAN  Never done  . PNA vac Low Risk Adult (1 of 2 - PCV13) Never done  . INFLUENZA VACCINE  Completed  . COVID-19 Vaccine  Completed  . HPV VACCINES  Aged Out    Physical Exam: Vitals:   11/28/20 1308  BP: 126/78  Pulse: 93  Temp: (!) 96.8 F (36 C)  TempSrc: Temporal  SpO2: 98%  Weight: 187 lb 9.6 oz (85.1 kg)  Height: 5\' 6"  (1.676 m)  Body mass index is 30.28 kg/m. Physical Exam Vitals reviewed.  Constitutional:      Appearance: Normal appearance. She is obese.  Eyes:     Extraocular Movements: Extraocular movements intact.     Pupils: Pupils are equal, round, and reactive to light.  Cardiovascular:     Rate and Rhythm: Normal rate and regular rhythm.     Pulses: Normal pulses.     Heart sounds: Normal heart sounds.  Pulmonary:     Effort: Pulmonary effort is normal.     Breath sounds: Rhonchi present.  Abdominal:     General: Bowel sounds are normal.     Tenderness: There is no abdominal tenderness.  Musculoskeletal:        General: Normal range of motion.     Right lower leg: No edema.     Left lower leg: No edema.     Comments:  Walks w/o assistive device  Skin:    General: Skin is warm and dry.     Coloration: Skin is pale.  Neurological:     General: No focal deficit present.     Mental Status: She is alert and oriented to person, place, and time.  Psychiatric:        Mood and Affect: Mood normal.     Comments: Flat affect    Labs reviewed: Basic Metabolic Panel: Recent Labs    06/13/20 1043  NA 142  K 4.9  CL 102  CO2 32  GLUCOSE 120*  BUN 16  CREATININE 0.90*  CALCIUM 9.6   Liver Function Tests: Recent Labs    06/13/20 1043  AST 19  ALT 18  BILITOT 0.8  PROT 6.8   No results for input(s): LIPASE, AMYLASE in the last 8760 hours. No results for input(s): AMMONIA in the last 8760 hours. CBC: Recent Labs    06/13/20 1043  WBC 6.9  NEUTROABS 3,698  HGB 15.9*  HCT 49.1*  MCV 98.0  PLT 304   Lipid Panel: Recent Labs    06/13/20 1043  CHOL 215*  HDL 81  LDLCALC 113*  TRIG 106  CHOLHDL 2.7   Lab Results  Component Value Date   HGBA1C 6.2 (H) 06/13/2020    Assessment/Plan 1. Chronic obstructive pulmonary disease, unspecified COPD type ([place]) -not on meds, asymptomatic -continues to smoke and not ready to quit  2. Chronic kidney disease, unspecified CKD stage -Avoid nephrotoxic agents like nsaids, dose adjust renally excreted meds, hydrate.  3. Spinal stenosis of lumbar region, unspecified whether neurogenic claudication present -limits her activity -encouraged tylenol over aleve due to CKD -also topicals, stretching, staying active as able -?gabapentin in future  4. Hyperglycemia -encouraged healthy lower carb diet and increased physical activity for a little weight loss and prevent progression to DM Lab Results  Component Value Date   HGBA1C 6.2 (H) 06/13/2020    5. Pure hypercholesterolemia -LDL above goal, encouraged diet and exercise with plan to recheck before next appt with Amy  6. Need for vaccination with 23-valent pneumococcal conjugate vaccine -given  today  Labs/tests ordered:  Lab Orders     CBC with Differential/Platelet     COMPLETE METABOLIC PANEL WITH GFR     Lipid panel     Hemoglobin A1c  She is to go get her mammogram and bone density at [facility] provided on paperwork  Next appt:  6 mos med mgt   Delva Derden L. Chenille Toor, D.O. [facility] [address],  [place] [address] Cell Phone (Mon-Fri 8am-5pm):  [phone] On Call:  [phone] & follow prompts after 5pm & weekends Office Phone:  [phone] Office Fax:  [phone]

## 2020-12-03 ENCOUNTER — Other Ambulatory Visit: Payer: Self-pay

## 2020-12-03 ENCOUNTER — Telehealth: Payer: Self-pay

## 2020-12-03 ENCOUNTER — Ambulatory Visit (INDEPENDENT_AMBULATORY_CARE_PROVIDER_SITE_OTHER): Payer: Medicare Other | Admitting: Nurse Practitioner

## 2020-12-03 DIAGNOSIS — Z0000 Encounter for general adult medical examination without abnormal findings: Secondary | ICD-10-CM

## 2020-12-03 NOTE — Telephone Encounter (Signed)
Alison Lewis, Alison Lewis are scheduled for a virtual visit with your provider today.    Just as we do with appointments in the office, we must obtain your consent to participate.  Your consent will be active for this visit and any virtual visit you may have with one of our providers in the next 365 days.    If you have a MyChart account, I can also send a copy of this consent to you electronically.  All virtual visits are billed to your insurance company just like a traditional visit in the office.  As this is a virtual visit, video technology does not allow for your provider to perform a traditional examination.  This may limit your provider's ability to fully assess your condition.  If your provider identifies any concerns that need to be evaluated in person or the need to arrange testing such as labs, EKG, etc, we will make arrangements to do so.    Although advances in technology are sophisticated, we cannot ensure that it will always work on either your end or our end.  If the connection with a video visit is poor, we may have to switch to a telephone visit.  With either a video or telephone visit, we are not always able to ensure that we have a secure connection.   I need to obtain your verbal consent now.   Are you willing to proceed with your visit today?   Alison Lewis has provided verbal consent on 12/03/2020 for a virtual visit (video or telephone).   Carroll Kinds, [facility] 12/03/2020  2:23 PM

## 2020-12-03 NOTE — Progress Notes (Signed)
Subjective:   Alison Lewis is a 85 y.o. female who presents for Medicare Annual (Subsequent) preventive examination.  Review of Systems     Cardiac Risk Factors include: sedentary lifestyle;obesity (BMI >30kg/m2);hypertension;advanced age (>42men, >56 women);smoking/ tobacco exposure     Objective:    There were no vitals filed for this visit. There is no height or weight on file to calculate BMI.  Advanced Directives 12/03/2020 11/28/2020 07/01/2020 05/30/2020 07/12/2018  Does Patient Have a Medical Advance Directive? Yes Yes Yes Yes Yes  Type of Advance Directive Living will [place];Living will [place];Living will [place];Out of facility DNR (pink MOST or yellow form);Living will [place];Living will  Does patient want to make changes to medical advance directive? - No - Patient declined No - Patient declined No - Patient declined -  Copy of [place] in Chart? - Yes - validated most recent copy scanned in chart (See row information) No - copy requested Yes - validated most recent copy scanned in chart (See row information) No - copy requested  Pre-existing out of facility DNR order (yellow form or pink MOST form) - - - Pink MOST/Yellow Form most recent copy in chart - Physician notified to receive inpatient order -    Current Medications (verified) Outpatient Encounter Medications as of 12/03/2020  Medication Sig  . CALCIUM PO Take by mouth daily.  Marland Kitchen DOXYCYCLINE PO Take 20 g by mouth as directed. Prior to dental procedures  . naproxen sodium (ALEVE) 220 MG tablet Take 220 mg by mouth daily.  . NON FORMULARY Vitamin daily   No facility-administered encounter medications on file as of 12/03/2020.    Allergies (verified) Patient has no known allergies.   History: Past Medical History:  Diagnosis Date  . Allergy   . Cholecystitis   . Colon polyps   . Hip bursitis   .  Hypertension   . Lumbar spinal stenosis   . Osteopenia   . Shingles   . Sinusitis   . Tobacco abuse    Past Surgical History:  Procedure Laterality Date  . CATARACT EXTRACTION, BILATERAL    . CHOLECYSTECTOMY     No family history on file. Social History   Socioeconomic History  . Marital status: Married    Spouse name: Jeneen Rinks   . Number of children: 2  . Years of education: Degree   . Highest education level: Associate degree: academic program  Occupational History  . Not on file  Tobacco Use  . Smoking status: Current Every Day Smoker  . Smokeless tobacco: Never Used  . Tobacco comment: 2-3 a day/ patients states to many years for how many years did you smoke  Vaping Use  . Vaping Use: Never used  Substance and Sexual Activity  . Alcohol use: Yes    Comment: 10  . Drug use: Never  . Sexual activity: Not Currently  Other Topics Concern  . Not on file  Social History Narrative   Diet: Left Blank      Do you drink/ eat things with caffeine? Yes      Marital status:    M                           What year were you married ? 1962      Do you live in a house, apartment,assistred living, condo, trailer, etc.)? House  Is it one or more stories? Yes      How many persons live in your home ? 2      Do you have any pets in your home ?(please list)  No      Highest Level of education completed: BS Home      Current or past profession:  Pharmacist, hospital, Retail, Homemaker      Do you exercise?    No                          Type & how often       ADVANCED DIRECTIVES (Please bring copies)      Do you have a living will?  Yes      Do you have a DNR form?   Yes                    If not, do you want to discuss one?       Do you have signed POA?HPOA forms?    Yes             If so, please bring to your appointment      FUNCTIONAL STATUS- To be completed by Spouse / child / Staff       Do you have difficulty bathing or dressing yourself ? No      Do you have difficulty  preparing food or eating ?  Yes      Do you have difficulty managing your mediation ?  No      Do you have difficulty managing your finances ? No      Do you have difficulty affording your medication ?  No      Social Determinants of Radio broadcast assistant Strain: Not on file  Food Insecurity: Not on file  Transportation Needs: Not on file  Physical Activity: Not on file  Stress: Not on file  Social Connections: Not on file    Tobacco Counseling Ready to quit: Not Answered Counseling given: Not Answered Comment: 2-3 a day/ patients states to many years for how many years did you smoke   Clinical Intake:  Pre-visit preparation completed: Yes  Pain : No/denies pain     BMI - recorded: 30.28 Nutritional Status: BMI > 30  Obese Nutritional Risks: None Diabetes: Yes  How often do you need to have someone help you when you read instructions, pamphlets, or other written materials from your doctor or pharmacy?: 1 - Never  Diabetic?no         Activities of Daily Living In your present state of health, do you have any difficulty performing the following activities: 12/03/2020  Hearing? N  Vision? N  Difficulty concentrating or making decisions? N  Walking or climbing stairs? Y  Dressing or bathing? N  Doing errands, shopping? N  Preparing Food and eating ? N  Using the Toilet? N  In the past six months, have you accidently leaked urine? Y  Do you have problems with loss of bowel control? N  Managing your Medications? N  Managing your Finances? N  Housekeeping or managing your Housekeeping? N  Some recent data might be hidden    Patient Care Team: Yvonna Alanis, NP as PCP - General (Adult Health Nurse Practitioner)  Indicate any recent Medical Services you may have received from other than [facility] providers in the past year (date may be approximate).     Assessment:  This is a routine wellness examination for [place].  Hearing/Vision screen  Hearing  Screening   125Hz  250Hz  500Hz  1000Hz  2000Hz  3000Hz  4000Hz  6000Hz  8000Hz   Right ear:           Left ear:           Comments: Patient has no hearing problems  Vision Screening Comments: Patient has no vision problems. Patient has eye exam within last year. Patient does not currently have eye doctor  Dietary issues and exercise activities discussed: Current Exercise Habits: The patient does not participate in regular exercise at present, Exercise limited by: orthopedic condition(s)  Goals    . Patient Stated     Would like to maintain current level of health      Depression Screen PHQ 2/9 Scores 12/03/2020 11/28/2020 05/30/2020  PHQ - 2 Score 0 0 0    Fall Risk Fall Risk  12/03/2020 11/28/2020 05/30/2020  Falls in the past year? 0 0 0  Number falls in past yr: 0 0 0  Injury with Fall? 0 0 0    FALL RISK PREVENTION PERTAINING TO THE HOME:  Any stairs in or around the home? Yes  If so, are there any without handrails? No  Home free of loose throw rugs in walkways, pet beds, electrical cords, etc? Yes  Adequate lighting in your home to reduce risk of falls? Yes   ASSISTIVE DEVICES UTILIZED TO PREVENT FALLS:  Life alert? No  Use of a cane, walker or w/c? No  Grab bars in the bathroom? Yes  Shower chair or bench in shower? Yes  Elevated toilet seat or a handicapped toilet? Yes   TIMED UP AND GO:  Was the test performed? No .    Cognitive Function:     6CIT Screen 12/03/2020  What Year? 0 points  What month? 0 points  What time? 0 points  Count back from 20 0 points  Months in reverse 0 points  Repeat phrase 0 points  Total Score 0    Immunizations Immunization History  Administered Date(s) Administered  . Fluad Quad(high Dose 65+) 05/31/2020  . Influenza,inj,quad, With Preservative 06/21/2017  . PFIZER(Purple Top)SARS-COV-2 Vaccination 10/07/2019, 10/27/2019, 06/15/2020  . Pneumococcal Polysaccharide-23 11/28/2020    TDAP status: Due, Education has been provided  regarding the importance of this vaccine. Advised may receive this vaccine at local pharmacy or Health Dept. Aware to provide a copy of the vaccination record if obtained from local pharmacy or Health Dept. Verbalized acceptance and understanding.  Flu Vaccine status: Up to date  Pneumococcal vaccine status: Up to date  Covid-19 vaccine status: Completed vaccines  Qualifies for Shingles Vaccine? Yes   Zostavax completed no Shingrix Completed?: No.    Education has been provided regarding the importance of this vaccine. Patient has been advised to call insurance company to determine out of pocket expense if they have not yet received this vaccine. Advised may also receive vaccine at local pharmacy or Health Dept. Verbalized acceptance and understanding.  Screening Tests Health Maintenance  Topic Date Due  . TETANUS/TDAP  Never done  . DEXA SCAN  Never done  . PNA vac Low Risk Adult (2 of 2 - PCV13) 11/28/2021  . INFLUENZA VACCINE  Completed  . COVID-19 Vaccine  Completed  . HPV VACCINES  Aged Out    Health Maintenance  Health Maintenance Due  Topic Date Due  . TETANUS/TDAP  Never done  . DEXA SCAN  Never done    Colorectal cancer screening: No  longer required.   Mammogram status: No longer required due to advance age.  Bone Density status: Ordered 11/28/2020. Pt provided with contact info and advised to call to schedule appt.  Lung Cancer Screening: (Low Dose CT Chest recommended if Age 32-80 years, 30 pack-year currently smoking OR have quit w/in 15years.) does not qualify.   Additional Screening:  Hepatitis C Screening: does not qualify;   Vision Screening: Recommended annual ophthalmology exams for early detection of glaucoma and other disorders of the eye. Is the patient up to date with their annual eye exam?  Yes  Who is the provider or what is the name of the office in which the patient attends annual eye exams? Needs new doctor If pt is not established with a  provider, would they like to be referred to a provider to establish care? No .   Dental Screening: Recommended annual dental exams for proper oral hygiene  Community Resource Referral / Chronic Care Management: CRR required this visit?  No   CCM required this visit?  No      Plan:     I have personally reviewed and noted the following in the patient's chart:   . Medical and social history . Use of alcohol, tobacco or illicit drugs  . Current medications and supplements . Functional ability and status . Nutritional status . Physical activity . Advanced directives . List of other physicians . Hospitalizations, surgeries, and ER visits in previous 12 months . Vitals . Screenings to include cognitive, depression, and falls . Referrals and appointments  In addition, I have reviewed and discussed with patient certain preventive protocols, quality metrics, and best practice recommendations. A written personalized care plan for preventive services as well as general preventive health recommendations were provided to patient.     Lauree Chandler, NP   12/03/2020   Nurse Notes:  Virtual Visit via Telephone Note  I connected with@ on 12/03/20 at  2:15 PM EDT by telephone and verified that I am speaking with the correct person using two identifiers.  Location: Patient: home Provider: [facility]    I discussed the limitations, risks, security and privacy concerns of performing an evaluation and management service by telephone and the availability of in person appointments. I also discussed with the patient that there may be a patient responsible charge related to this service. The patient expressed understanding and agreed to proceed.   I discussed the assessment and treatment plan with the patient. The patient was provided an opportunity to ask questions and all were answered. The patient agreed with the plan and demonstrated an understanding of the instructions.   The patient  was advised to call back or seek an in-person evaluation if the symptoms worsen or if the condition fails to improve as anticipated.  I provided 18 minutes of non-face-to-face time during this encounter.  Carlos American. Harle Battiest Avs printed and mailed

## 2020-12-03 NOTE — Patient Instructions (Signed)
Alison Lewis , Thank you for taking time to come for your Medicare Wellness Visit. I appreciate your ongoing commitment to your health goals. Please review the following plan we discussed and let me know if I can assist you in the future.   Screening recommendations/referrals: Colonoscopy - aged out  Mammogram to call [facility] to schedule Bone Density to call [facility] to schedule  Recommended yearly ophthalmology/optometry visit for glaucoma screening and checkup Recommended yearly dental visit for hygiene and checkup  Vaccinations: Influenza vaccine recommended to get yearly Pneumococcal vaccine recommended to get PREVNAR 13 in 1 year. (after 11/28/21) Tdap vaccine- RECOMMENDED_ to get at your local pharmacy Shingles vaccine RECOMMENDED- to get at your local pharmacy   Advanced directives: on file.   Conditions/risks identified: obesity, advance age, smoker.   Next appointment: 1 year for AWV   Preventive Care 29 Years and Older, Female Preventive care refers to lifestyle choices and visits with your health care provider that can promote health and wellness. What does preventive care include?  A yearly physical exam. This is also called an annual well check.  Dental exams once or twice a year.  Routine eye exams. Ask your health care provider how often you should have your eyes checked.  Personal lifestyle choices, including:  Daily care of your teeth and gums.  Regular physical activity.  Eating a healthy diet.  Avoiding tobacco and drug use.  Limiting alcohol use.  Practicing safe sex.  Taking low-dose aspirin every day.  Taking vitamin and mineral supplements as recommended by your health care provider. What happens during an annual well check? The services and screenings done by your health care provider during your annual well check will depend on your age, overall health, lifestyle risk factors, and family history of disease. Counseling  Your health care provider  may ask you questions about your:  Alcohol use.  Tobacco use.  Drug use.  Emotional well-being.  Home and relationship well-being.  Sexual activity.  Eating habits.  History of falls.  Memory and ability to understand (cognition).  Work and work Statistician.  Reproductive health. Screening  You may have the following tests or measurements:  Height, weight, and BMI.  Blood pressure.  Lipid and cholesterol levels. These may be checked every 5 years, or more frequently if you are over 105 years old.  Skin check.  Lung cancer screening. You may have this screening every year starting at age 30 if you have a 30-pack-year history of smoking and currently smoke or have quit within the past 15 years.  Fecal occult blood test (FOBT) of the stool. You may have this test every year starting at age 85.  Flexible sigmoidoscopy or colonoscopy. You may have a sigmoidoscopy every 5 years or a colonoscopy every 10 years starting at age 30.  Hepatitis C blood test.  Hepatitis B blood test.  Sexually transmitted disease (STD) testing.  Diabetes screening. This is done by checking your blood sugar (glucose) after you have not eaten for a while (fasting). You may have this done every 1-3 years.  Bone density scan. This is done to screen for osteoporosis. You may have this done starting at age 67.  Mammogram. This may be done every 1-2 years. Talk to your health care provider about how often you should have regular mammograms. Talk with your health care provider about your test results, treatment options, and if necessary, the need for more tests. Vaccines  Your health care provider may recommend certain vaccines, such  as:  Influenza vaccine. This is recommended every year.  Tetanus, diphtheria, and acellular pertussis (Tdap, Td) vaccine. You may need a Td booster every 10 years.  Zoster vaccine. You may need this after age 58.  Pneumococcal 13-valent conjugate (PCV13) vaccine.  One dose is recommended after age 48.  Pneumococcal polysaccharide (PPSV23) vaccine. One dose is recommended after age 52. Talk to your health care provider about which screenings and vaccines you need and how often you need them. This information is not intended to replace advice given to you by your health care provider. Make sure you discuss any questions you have with your health care provider. Document Released: 10/04/2015 Document Revised: 05/27/2016 Document Reviewed: 07/09/2015 Elsevier Interactive Patient Education  2017 [place] Prevention in the Home Falls can cause injuries. They can happen to people of all ages. There are many things you can do to make your home safe and to help prevent falls. What can I do on the outside of my home?  Regularly fix the edges of walkways and driveways and fix any cracks.  Remove anything that might make you trip as you walk through a door, such as a raised step or threshold.  Trim any bushes or trees on the path to your home.  Use bright outdoor lighting.  Clear any walking paths of anything that might make someone trip, such as rocks or tools.  Regularly check to see if handrails are loose or broken. Make sure that both sides of any steps have handrails.  Any raised decks and porches should have guardrails on the edges.  Have any leaves, snow, or ice cleared regularly.  Use sand or salt on walking paths during winter.  Clean up any spills in your garage right away. This includes oil or grease spills. What can I do in the bathroom?  Use night lights.  Install grab bars by the toilet and in the tub and shower. Do not use towel bars as grab bars.  Use non-skid mats or decals in the tub or shower.  If you need to sit down in the shower, use a plastic, non-slip stool.  Keep the floor dry. Clean up any water that spills on the floor as soon as it happens.  Remove soap buildup in the tub or shower regularly.  Attach bath  mats securely with double-sided non-slip rug tape.  Do not have throw rugs and other things on the floor that can make you trip. What can I do in the bedroom?  Use night lights.  Make sure that you have a light by your bed that is easy to reach.  Do not use any sheets or blankets that are too big for your bed. They should not hang down onto the floor.  Have a firm chair that has side arms. You can use this for support while you get dressed.  Do not have throw rugs and other things on the floor that can make you trip. What can I do in the kitchen?  Clean up any spills right away.  Avoid walking on wet floors.  Keep items that you use a lot in easy-to-reach places.  If you need to reach something above you, use a strong step stool that has a grab bar.  Keep electrical cords out of the way.  Do not use floor polish or wax that makes floors slippery. If you must use wax, use non-skid floor wax.  Do not have throw rugs and other things on the  floor that can make you trip. What can I do with my stairs?  Do not leave any items on the stairs.  Make sure that there are handrails on both sides of the stairs and use them. Fix handrails that are broken or loose. Make sure that handrails are as long as the stairways.  Check any carpeting to make sure that it is firmly attached to the stairs. Fix any carpet that is loose or worn.  Avoid having throw rugs at the top or bottom of the stairs. If you do have throw rugs, attach them to the floor with carpet tape.  Make sure that you have a light switch at the top of the stairs and the bottom of the stairs. If you do not have them, ask someone to add them for you. What else can I do to help prevent falls?  Wear shoes that:  Do not have high heels.  Have rubber bottoms.  Are comfortable and fit you well.  Are closed at the toe. Do not wear sandals.  If you use a stepladder:  Make sure that it is fully opened. Do not climb a closed  stepladder.  Make sure that both sides of the stepladder are locked into place.  Ask someone to hold it for you, if possible.  Clearly mark and make sure that you can see:  Any grab bars or handrails.  First and last steps.  Where the edge of each step is.  Use tools that help you move around (mobility aids) if they are needed. These include:  Canes.  Walkers.  Scooters.  Crutches.  Turn on the lights when you go into a dark area. Replace any light bulbs as soon as they burn out.  Set up your furniture so you have a clear path. Avoid moving your furniture around.  If any of your floors are uneven, fix them.  If there are any pets around you, be aware of where they are.  Review your medicines with your doctor. Some medicines can make you feel dizzy. This can increase your chance of falling. Ask your doctor what other things that you can do to help prevent falls. This information is not intended to replace advice given to you by your health care provider. Make sure you discuss any questions you have with your health care provider. Document Released: 07/04/2009 Document Revised: 02/13/2016 Document Reviewed: 10/12/2014 Elsevier Interactive Patient Education  2017 [employer].

## 2020-12-03 NOTE — Progress Notes (Signed)
This service is provided via telemedicine  No vital signs collected/recorded due to the encounter was a telemedicine visit.   Location of patient (ex: home, work): home  Patient consents to a telephone visit:  Yes, see encounter dated 12/03/2020  Location of the provider (ex: office, home):  [place]  Name of any referring provider: Hollace Kinnier, DO  Names of all persons participating in the telemedicine service and their role in the encounter:  Sherrie Mustache, Nurse Practitioner, Carroll Kinds, CMA, and patient.   Time spent on call:  12 minutes with medical assistant

## 2020-12-03 NOTE — Telephone Encounter (Signed)
This encounter was created in error - please disregard.

## 2020-12-27 DIAGNOSIS — Z23 Encounter for immunization: Secondary | ICD-10-CM | POA: Diagnosis not present

## 2021-04-29 ENCOUNTER — Other Ambulatory Visit: Payer: Self-pay

## 2021-04-29 DIAGNOSIS — N189 Chronic kidney disease, unspecified: Secondary | ICD-10-CM

## 2021-04-29 DIAGNOSIS — R739 Hyperglycemia, unspecified: Secondary | ICD-10-CM

## 2021-04-29 DIAGNOSIS — E7800 Pure hypercholesterolemia, unspecified: Secondary | ICD-10-CM

## 2021-05-22 DIAGNOSIS — Z23 Encounter for immunization: Secondary | ICD-10-CM | POA: Diagnosis not present

## 2021-05-27 ENCOUNTER — Other Ambulatory Visit: Payer: Medicare Other

## 2021-05-29 ENCOUNTER — Ambulatory Visit: Payer: Medicare Other | Admitting: Orthopedic Surgery

## 2021-05-30 ENCOUNTER — Other Ambulatory Visit: Payer: Medicare Other

## 2021-06-03 ENCOUNTER — Ambulatory Visit: Payer: Medicare Other | Admitting: Orthopedic Surgery

## 2021-06-10 DIAGNOSIS — Z23 Encounter for immunization: Secondary | ICD-10-CM | POA: Diagnosis not present

## 2021-06-26 DIAGNOSIS — Z6829 Body mass index (BMI) 29.0-29.9, adult: Secondary | ICD-10-CM | POA: Diagnosis not present

## 2021-06-26 DIAGNOSIS — M8580 Other specified disorders of bone density and structure, unspecified site: Secondary | ICD-10-CM | POA: Diagnosis not present

## 2021-06-26 DIAGNOSIS — E663 Overweight: Secondary | ICD-10-CM | POA: Diagnosis not present

## 2021-06-26 DIAGNOSIS — J449 Chronic obstructive pulmonary disease, unspecified: Secondary | ICD-10-CM | POA: Diagnosis not present

## 2021-06-26 DIAGNOSIS — N182 Chronic kidney disease, stage 2 (mild): Secondary | ICD-10-CM | POA: Diagnosis not present

## 2021-06-26 DIAGNOSIS — R7303 Prediabetes: Secondary | ICD-10-CM | POA: Diagnosis not present

## 2021-06-26 DIAGNOSIS — E559 Vitamin D deficiency, unspecified: Secondary | ICD-10-CM | POA: Diagnosis not present

## 2021-06-26 DIAGNOSIS — M48062 Spinal stenosis, lumbar region with neurogenic claudication: Secondary | ICD-10-CM | POA: Diagnosis not present

## 2021-07-09 DIAGNOSIS — H353122 Nonexudative age-related macular degeneration, left eye, intermediate dry stage: Secondary | ICD-10-CM | POA: Diagnosis not present

## 2021-07-09 DIAGNOSIS — H524 Presbyopia: Secondary | ICD-10-CM | POA: Diagnosis not present

## 2021-07-09 DIAGNOSIS — D3132 Benign neoplasm of left choroid: Secondary | ICD-10-CM | POA: Diagnosis not present

## 2021-07-09 DIAGNOSIS — Z135 Encounter for screening for eye and ear disorders: Secondary | ICD-10-CM | POA: Diagnosis not present

## 2021-07-09 DIAGNOSIS — H353113 Nonexudative age-related macular degeneration, right eye, advanced atrophic without subfoveal involvement: Secondary | ICD-10-CM | POA: Diagnosis not present

## 2021-07-14 ENCOUNTER — Other Ambulatory Visit: Payer: Self-pay | Admitting: Internal Medicine

## 2021-07-14 DIAGNOSIS — Z1231 Encounter for screening mammogram for malignant neoplasm of breast: Secondary | ICD-10-CM

## 2021-07-29 DIAGNOSIS — D170 Benign lipomatous neoplasm of skin and subcutaneous tissue of head, face and neck: Secondary | ICD-10-CM | POA: Diagnosis not present

## 2021-07-29 DIAGNOSIS — L570 Actinic keratosis: Secondary | ICD-10-CM | POA: Diagnosis not present

## 2021-07-29 DIAGNOSIS — L918 Other hypertrophic disorders of the skin: Secondary | ICD-10-CM | POA: Diagnosis not present

## 2021-07-29 DIAGNOSIS — L821 Other seborrheic keratosis: Secondary | ICD-10-CM | POA: Diagnosis not present

## 2021-12-18 NOTE — Progress Notes (Shared)
?[facility] Note ? ?12/23/2021 ? ?  ? ?CHIEF COMPLAINT ?Patient presents for No chief complaint on file. ? ? ?HISTORY OF PRESENT ILLNESS: ?Alison Lewis is a 86 y.o. female who presents to the clinic today for:  ? ? ? ?Referring physician: ?No referring provider defined for this encounter. ? ?HISTORICAL INFORMATION:  ? ?Selected notes from the [place] ?Referred by Dr. Raenette Rover for concern of macular degeneration ?LEE:  ?Ocular Hx- ?PMH- ?  ? ?CURRENT MEDICATIONS: ?No current outpatient medications on file. (Ophthalmic Drugs)  ? ?No current facility-administered medications for this visit. (Ophthalmic Drugs)  ? ?Current Outpatient Medications (Other)  ?Medication Sig  ? CALCIUM PO Take by mouth daily.  ? DOXYCYCLINE PO Take 20 g by mouth as directed. Prior to dental procedures  ? naproxen sodium (ALEVE) 220 MG tablet Take 220 mg by mouth daily.  ? NON FORMULARY Vitamin daily  ? ?No current facility-administered medications for this visit. (Other)  ? ? ? ? ?REVIEW OF SYSTEMS: ? ? ? ?ALLERGIES ?No Known Allergies ? ?PAST MEDICAL HISTORY ?Past Medical History:  ?Diagnosis Date  ? Allergy   ? Cholecystitis   ? Colon polyps   ? Hip bursitis   ? Hypertension   ? Lumbar spinal stenosis   ? Osteopenia   ? Shingles   ? Sinusitis   ? Tobacco abuse   ? ?Past Surgical History:  ?Procedure Laterality Date  ? CATARACT EXTRACTION, BILATERAL    ? CHOLECYSTECTOMY    ? ? ?FAMILY HISTORY ?No family history on file. ? ?SOCIAL HISTORY ?Social History  ? ?Tobacco Use  ? Smoking status: Every Day  ? Smokeless tobacco: Never  ? Tobacco comments:  ?  2-3 a day/ patients states to many years for how many years did you smoke  ?Vaping Use  ? Vaping Use: Never used  ?Substance Use Topics  ? Alcohol use: Yes  ?  Comment: 10  ? Drug use: Never  ? ?  ? ?  ? ?OPHTHALMIC EXAM: ? ?Not recorded ?  ? ? ?IMAGING AND PROCEDURES  ?Imaging and Procedures for 12/23/2021 ? ? ? ?  ?  ? ?  ?ASSESSMENT/PLAN: ? ?   ICD-10-CM   ?1. Retinal edema  H35.81   ?  ? ? ?1. ? ?2. ? ?3. ? ?Ophthalmic Meds Ordered this visit:  ?No orders of the defined types were placed in this encounter. ? ? ?  ? ?No follow-ups on file. ? ?There are no Patient Instructions on file for this visit. ? ? ?Explained the diagnoses, plan, and follow up with the patient and they expressed understanding.  Patient expressed understanding of the importance of proper follow up care.  ? ?This document serves as a record of services personally performed by Gardiner Sleeper, MD, PhD. It was created on their behalf by San Jetty. Owens Shark, OA an ophthalmic technician. The creation of this record is the provider's dictation and/or activities during the visit.   ? ?Electronically signed by: San Jetty. Owens Shark, [place] 03.30.2023 12:43 PM ? ? ?Gardiner Sleeper, M.D., Ph.D. ?Diseases & Surgery of the Retina and Vitreous ?[place] ? ? ? ? ? ?Abbreviations: ?M myopia (nearsighted); A astigmatism; H hyperopia (farsighted); P presbyopia; Mrx spectacle prescription;  CTL contact lenses; OD right eye; OS left eye; OU both eyes  XT exotropia; ET esotropia; PEK punctate epithelial keratitis; PEE punctate epithelial erosions; DES dry eye syndrome; MGD meibomian  gland dysfunction; ATs artificial tears; PFAT's preservative free artificial tears; [place] nuclear sclerotic cataract; PSC posterior subcapsular cataract; ERM epi-retinal membrane; PVD posterior vitreous detachment; RD retinal detachment; DM diabetes mellitus; DR diabetic retinopathy; NPDR non-proliferative diabetic retinopathy; PDR proliferative diabetic retinopathy; CSME clinically significant macular edema; DME diabetic macular edema; dbh dot blot hemorrhages; CWS cotton wool spot; POAG primary open angle glaucoma; C/D cup-to-disc ratio; HVF humphrey visual field; GVF goldmann visual field; OCT optical coherence tomography; IOP intraocular pressure; BRVO Branch retinal vein occlusion; CRVO central retinal vein  occlusion; CRAO central retinal artery occlusion; BRAO branch retinal artery occlusion; RT retinal tear; SB scleral buckle; PPV pars plana vitrectomy; VH Vitreous hemorrhage; PRP panretinal laser photocoagulation; IVK intravitreal kenalog; VMT vitreomacular traction; MH Macular hole;  NVD neovascularization of the disc; NVE neovascularization elsewhere; AREDS age related eye disease study; ARMD age related macular degeneration; POAG primary open angle glaucoma; EBMD epithelial/anterior basement membrane dystrophy; ACIOL anterior chamber intraocular lens; IOL intraocular lens; PCIOL posterior chamber intraocular lens; Phaco/IOL phacoemulsification with intraocular lens placement; [place] photorefractive keratectomy; LASIK laser assisted in situ keratomileusis; HTN hypertension; DM diabetes mellitus; COPD chronic obstructive pulmonary disease ? ?

## 2021-12-23 ENCOUNTER — Encounter (INDEPENDENT_AMBULATORY_CARE_PROVIDER_SITE_OTHER): Payer: Self-pay

## 2021-12-23 ENCOUNTER — Encounter (INDEPENDENT_AMBULATORY_CARE_PROVIDER_SITE_OTHER): Payer: Medicare Other | Admitting: Ophthalmology

## 2021-12-23 DIAGNOSIS — H3581 Retinal edema: Secondary | ICD-10-CM

## 2021-12-24 ENCOUNTER — Emergency Department (HOSPITAL_BASED_OUTPATIENT_CLINIC_OR_DEPARTMENT_OTHER): Payer: Medicare Other | Admitting: Radiology

## 2021-12-24 ENCOUNTER — Other Ambulatory Visit: Payer: Self-pay

## 2021-12-24 ENCOUNTER — Emergency Department (HOSPITAL_BASED_OUTPATIENT_CLINIC_OR_DEPARTMENT_OTHER): Payer: Medicare Other

## 2021-12-24 ENCOUNTER — Encounter (HOSPITAL_BASED_OUTPATIENT_CLINIC_OR_DEPARTMENT_OTHER): Payer: Self-pay | Admitting: Emergency Medicine

## 2021-12-24 ENCOUNTER — Emergency Department (HOSPITAL_BASED_OUTPATIENT_CLINIC_OR_DEPARTMENT_OTHER)
Admission: EM | Admit: 2021-12-24 | Discharge: 2021-12-24 | Disposition: A | Discharge status: Home / Self Care | Payer: Medicare Other | Attending: Emergency Medicine | Admitting: Emergency Medicine

## 2021-12-24 DIAGNOSIS — M79605 Pain in left leg: Secondary | ICD-10-CM | POA: Diagnosis not present

## 2021-12-24 DIAGNOSIS — R1010 Upper abdominal pain, unspecified: Secondary | ICD-10-CM | POA: Insufficient documentation

## 2021-12-24 DIAGNOSIS — Z20822 Contact with and (suspected) exposure to covid-19: Secondary | ICD-10-CM | POA: Insufficient documentation

## 2021-12-24 DIAGNOSIS — J189 Pneumonia, unspecified organism: Secondary | ICD-10-CM | POA: Diagnosis not present

## 2021-12-24 DIAGNOSIS — M79604 Pain in right leg: Secondary | ICD-10-CM | POA: Diagnosis not present

## 2021-12-24 DIAGNOSIS — R059 Cough, unspecified: Secondary | ICD-10-CM | POA: Diagnosis present

## 2021-12-24 LAB — CBC WITH DIFFERENTIAL/PLATELET
Abs Immature Granulocytes: 0.02 10*3/uL (ref 0.00–0.07)
Basophils Absolute: 0.1 10*3/uL (ref 0.0–0.1)
Basophils Relative: 1 %
Eosinophils Absolute: 0.3 10*3/uL (ref 0.0–0.5)
Eosinophils Relative: 3 %
HCT: 42.7 % (ref 36.0–46.0)
Hemoglobin: 13.9 g/dL (ref 12.0–15.0)
Immature Granulocytes: 0 %
Lymphocytes Relative: 23 %
Lymphs Abs: 2.2 10*3/uL (ref 0.7–4.0)
MCH: 31.2 pg (ref 26.0–34.0)
MCHC: 32.6 g/dL (ref 30.0–36.0)
MCV: 95.7 fL (ref 80.0–100.0)
Monocytes Absolute: 0.7 10*3/uL (ref 0.1–1.0)
Monocytes Relative: 7 %
Neutro Abs: 6.5 10*3/uL (ref 1.7–7.7)
Neutrophils Relative %: 66 %
Platelets: 360 10*3/uL (ref 150–400)
RBC: 4.46 MIL/uL (ref 3.87–5.11)
RDW: 13.1 % (ref 11.5–15.5)
WBC: 9.7 10*3/uL (ref 4.0–10.5)
nRBC: 0 % (ref 0.0–0.2)

## 2021-12-24 LAB — COMPREHENSIVE METABOLIC PANEL
ALT: 10 U/L (ref 0–44)
AST: 13 U/L — ABNORMAL LOW (ref 15–41)
Albumin: 4 g/dL (ref 3.5–5.0)
Alkaline Phosphatase: 67 U/L (ref 38–126)
Anion gap: 13 (ref 5–15)
BUN: 23 mg/dL (ref 8–23)
CO2: 25 mmol/L (ref 22–32)
Calcium: 9.5 mg/dL (ref 8.9–10.3)
Chloride: 100 mmol/L (ref 98–111)
Creatinine, Ser: 0.85 mg/dL (ref 0.44–1.00)
GFR, Estimated: >60 mL/min (ref >60)
Glucose, Bld: 104 mg/dL — ABNORMAL HIGH (ref 70–99)
Potassium: 4.2 mmol/L (ref 3.5–5.1)
Sodium: 138 mmol/L (ref 135–145)
Total Bilirubin: 0.7 mg/dL (ref 0.3–1.2)
Total Protein: 7.2 g/dL (ref 6.5–8.1)

## 2021-12-24 LAB — RESP PANEL BY RT-PCR (FLU A&B, COVID) ARPGX2
Influenza A by PCR: NEGATIVE
Influenza B by PCR: NEGATIVE
SARS Coronavirus 2 by RT PCR: NEGATIVE

## 2021-12-24 LAB — LIPASE, BLOOD: Lipase: 13 U/L (ref 11–51)

## 2021-12-24 MED ORDER — DOXYCYCLINE HYCLATE 100 MG PO CAPS: 100.0000 mg | ORAL_CAPSULE | Freq: Two times a day (BID) | ORAL | 0 refills | Status: DC

## 2021-12-24 MED ORDER — ONDANSETRON HCL 4 MG/2ML IJ SOLN
4.0000 mg | Freq: Once | INTRAMUSCULAR | Status: AC
Start: 2021-12-24 — End: 2021-12-24
  Administered 2021-12-24: 4 mg via INTRAVENOUS
  Filled 2021-12-24: qty 2

## 2021-12-24 MED ORDER — SODIUM CHLORIDE 0.9 % IV BOLUS
500.0000 mL | Freq: Once | INTRAVENOUS | Status: AC
Start: 2021-12-24 — End: 2021-12-24
  Administered 2021-12-24: 500 mL via INTRAVENOUS

## 2021-12-24 MED ORDER — IOHEXOL 300 MG/ML  SOLN
100.0000 mL | Freq: Once | INTRAMUSCULAR | Status: AC | PRN
  Administered 2021-12-24: 85 mL via INTRAVENOUS

## 2021-12-24 NOTE — ED Notes (Signed)
Extra gold and blue tube drawn and sent to lab ?

## 2021-12-24 NOTE — ED Triage Notes (Signed)
Pt has general leg pains for years and today says she just cant take it anymore, she feels awful and can t function.  ?

## 2021-12-24 NOTE — Discharge Instructions (Signed)
Your chest x-ray showed signs of pneumonia take antibiotics as prescribed and follow-up with your doctor.  Return for shortness of breath or difficulty breathing. ?Follow-up with the orthopedic or neurosurgery for advice regarding your spinal stenosis history.  Return if you have difficulty urinating, weakness in your legs or new concerns. ?You can try Pepcid or Prilosec from the pharmacy for 1 to 2 weeks for acid reflux symptoms. ?You can take Tylenol every 4 hours as needed for pain. ?

## 2021-12-24 NOTE — ED Notes (Signed)
Dc instructions reviewed with patient. Patient voiced understanding. Dc with belongings.  °

## 2021-12-24 NOTE — ED Notes (Signed)
ED Provider at bedside. 

## 2021-12-26 ENCOUNTER — Other Ambulatory Visit: Payer: Self-pay

## 2021-12-26 ENCOUNTER — Emergency Department (HOSPITAL_BASED_OUTPATIENT_CLINIC_OR_DEPARTMENT_OTHER)
Admission: EM | Admit: 2021-12-26 | Discharge: 2021-12-26 | Disposition: A | Discharge status: Home / Self Care | Payer: Medicare Other | Attending: Emergency Medicine | Admitting: Emergency Medicine

## 2021-12-26 ENCOUNTER — Encounter (HOSPITAL_BASED_OUTPATIENT_CLINIC_OR_DEPARTMENT_OTHER): Payer: Self-pay

## 2021-12-26 ENCOUNTER — Telehealth: Payer: Self-pay | Admitting: Surgery

## 2021-12-26 DIAGNOSIS — J449 Chronic obstructive pulmonary disease, unspecified: Secondary | ICD-10-CM | POA: Insufficient documentation

## 2021-12-26 DIAGNOSIS — F17200 Nicotine dependence, unspecified, uncomplicated: Secondary | ICD-10-CM | POA: Insufficient documentation

## 2021-12-26 DIAGNOSIS — Z0000 Encounter for general adult medical examination without abnormal findings: Secondary | ICD-10-CM | POA: Insufficient documentation

## 2021-12-26 DIAGNOSIS — Z09 Encounter for follow-up examination after completed treatment for conditions other than malignant neoplasm: Secondary | ICD-10-CM

## 2021-12-26 NOTE — ED Provider Notes (Signed)
?[facility] ?Provider Note ? ? ?CSN: [id number] ?Arrival date & time: 12/24/21  1126 ? ?  ? ?History ? ?Chief Complaint  ?Patient presents with  ? Generalized Body Aches  ? ? ?Alison Lewis is a 86 y.o. female. ? ?Patient presents with primarily bilateral leg pain that she has had for months but has been getting worse.  Patient's had discussion with primary doctor about there is options of pain control.  Patient feels generally unwell and difficulty functioning due to this pain.  Patient had mild cough and also has had mild upper abdominal discomfort intermittent.  No fevers or chills.  No shortness of breath or chest pain.  No cardiac history.  No fevers or chills.  Patient has a history of spinal stenosis, has not had any weakness or difficulty with bowel or bladder function.  Patient does not have a neurosurgeon, patient is seen orthopedics in the past ? ? ?  ? ?Home Medications ?Prior to Admission medications   ?Medication Sig Start Date End Date Taking? Authorizing Provider  ?doxycycline (VIBRAMYCIN) 100 MG capsule Take 1 capsule (100 mg total) by mouth 2 (two) times daily. One po bid x 7 days 12/24/21  Yes Elnora Morrison, MD  ?CALCIUM PO Take by mouth daily.    [provider]  ?DOXYCYCLINE PO Take 20 g by mouth as directed. Prior to dental procedures    [provider]  ?naproxen sodium (ALEVE) 220 MG tablet Take 220 mg by mouth daily.    [provider]  ?NON FORMULARY Vitamin daily    [provider]  ?   ? ?Allergies    ?Patient has no known allergies.   ? ?Review of Systems   ?Review of Systems  ?Constitutional:  Negative for chills and fever.  ?HENT:  Positive for congestion.   ?Eyes:  Negative for visual disturbance.  ?Respiratory:  Positive for cough. Negative for shortness of breath.   ?Cardiovascular:  Negative for chest pain.  ?Gastrointestinal:  Negative for abdominal pain and vomiting.  ?Genitourinary:  Negative for dysuria and flank  pain.  ?Musculoskeletal:  Positive for arthralgias. Negative for back pain, joint swelling, neck pain and neck stiffness.  ?Skin:  Negative for rash.  ?Neurological:  Negative for light-headedness and headaches.  ? ?Physical Exam ?Updated Vital Signs ?BP (!) 163/78   Pulse 87   Temp 98.1 ?F (36.7 ?C) (Oral)   Resp 16   SpO2 (!) 85%  ?Physical Exam ?Vitals and nursing note reviewed.  ?Constitutional:   ?   General: She is not in acute distress. ?   Appearance: She is well-developed. She is not ill-appearing.  ?HENT:  ?   Head: Normocephalic and atraumatic.  ?   Nose: Congestion present.  ?   Mouth/Throat:  ?   Mouth: Mucous membranes are moist.  ?Eyes:  ?   General:     ?   Right eye: No discharge.     ?   Left eye: No discharge.  ?   Conjunctiva/sclera: Conjunctivae normal.  ?Neck:  ?   Trachea: No tracheal deviation.  ?Cardiovascular:  ?   Rate and Rhythm: Normal rate and regular rhythm.  ?Pulmonary:  ?   Effort: Pulmonary effort is normal.  ?   Breath sounds: Normal breath sounds.  ?Abdominal:  ?   General: There is no distension.  ?   Palpations: Abdomen is soft.  ?   Tenderness: There is abdominal tenderness (epigastric). There is no guarding.  ?  Musculoskeletal:     ?   General: No swelling.  ?   Cervical back: Normal range of motion and neck supple. No rigidity.  ?Skin: ?   General: Skin is warm.  ?   Capillary Refill: Capillary refill takes less than 2 seconds.  ?   Findings: No rash.  ?Neurological:  ?   General: No focal deficit present.  ?   Mental Status: She is alert.  ?   Cranial Nerves: No cranial nerve deficit.  ?   Comments: Patient has 5+ strength flexion extension of all major joints bilateral upper and lower extremities.  Sensation intact palpation major nerve areas.  No significant edema or calf tenderness in the lower legs.distal pulses intact.  ?Psychiatric:     ?   Mood and Affect: Mood normal.  ? ? ?ED Results / Procedures / Treatments   ?Labs ?(all labs ordered are listed, but only  abnormal results are displayed) ?Labs Reviewed  ?COMPREHENSIVE METABOLIC PANEL - Abnormal; Notable for the following components:  ?    Result Value  ? Glucose, Bld 104 (*)   ? AST 13 (*)   ? All other components within normal limits  ?RESP PANEL BY RT-PCR (FLU A&B, COVID) [id number]  ?CBC WITH DIFFERENTIAL/PLATELET  ?LIPASE, BLOOD  ? ? ?EKG ?None ? ?Radiology ?No results found. ?DG Chest 2 View ? ?Result Date: 12/24/2021 ?CLINICAL DATA:  Body aches, nausea and vomiting. EXAM: CHEST - 2 VIEW COMPARISON:  None. FINDINGS: Trachea is midline. Heart size normal. Thoracic aorta is calcified. Mild bibasilar mixed interstitial and airspace opacification. No dense airspace consolidation. No pleural fluid. IMPRESSION: Mild bibasilar mixed interstitial and airspace opacification may be due to an atypical or viral pneumonia. Electronically Signed   By: Lorin Picket M.D.   On: 12/24/2021 13:37  ? ?CT ABDOMEN PELVIS W CONTRAST ? ?Result Date: 12/24/2021 ?CLINICAL DATA:  Nausea and vomiting.  Abdominal pain. EXAM: CT ABDOMEN AND PELVIS WITH CONTRAST TECHNIQUE: Multidetector CT imaging of the abdomen and pelvis was performed using the standard protocol following bolus administration of intravenous contrast. RADIATION DOSE REDUCTION: This exam was performed according to the departmental dose-optimization program which includes automated exposure control, adjustment of the mA and/or kV according to patient size and/or use of iterative reconstruction technique. CONTRAST:  7m OMNIPAQUE IOHEXOL 300 MG/ML  SOLN COMPARISON:  None. FINDINGS: Lower chest: No acute findings. Heart size normal. No pericardial or pleural effusion. Distal esophagus is unremarkable. Hepatobiliary: Liver margin is slightly irregular. Subcentimeter low-attenuation lesion in the inferior right hepatic lobe, too small to characterize. Cholecystectomy. No unexpected biliary ductal dilatation. Pancreas: Negative. Spleen: Negative. Adrenals/Urinary Tract: Adrenal glands  are unremarkable. Cortical scarring in the kidneys bilaterally. Kidneys are otherwise unremarkable. Ureters are decompressed. Bladder is grossly unremarkable. Stomach/Bowel: Possible distal gastric wall thickening. Stomach and small bowel are otherwise unremarkable. Appendix is not readily visualized. Colon is unremarkable. Vascular/Lymphatic: Atherosclerotic calcification of the aorta. No pathologically enlarged lymph nodes. Reproductive: Uterus is visualized.  No adnexal mass. Other: No free fluid.  Mesenteries and peritoneum are unremarkable. Musculoskeletal: Degenerative changes in the spine. No worrisome lytic or sclerotic lesions. IMPRESSION: 1. Possible distal gastric wall thickening, suggesting gastritis. No additional findings to explain the patient's clinical history. 2. Liver margin appears slightly irregular, raising suspicion for cirrhosis. 3.  Aortic atherosclerosis (ICD10-I70.0). Electronically Signed   By: MLorin PicketM.D.   On: 12/24/2021 13:33   ? ?Procedures ?Procedures  ? ? ?Medications Ordered in ED ?Medications  ?ondansetron (ZOFRAN)  injection 4 mg (4 mg Intravenous Given 12/24/21 1256)  ?sodium chloride 0.9 % bolus 500 mL (0 mLs Intravenous Stopped 12/24/21 1353)  ?iohexol (OMNIPAQUE) 300 MG/ML solution 100 mL (85 mLs Intravenous Contrast Given 12/24/21 1315)  ? ? ?ED Course/ Medical Decision Making/ A&P ?  ?                        ?Medical Decision Making ?Amount and/or Complexity of Data Reviewed ?Labs: ordered. ?Radiology: ordered. ? ?Risk ?Prescription drug management. ? ? ?Patient presents with 2 separate concerns.  Patient's had had intermittent persistent bilateral leg pain for years however is gotten the point where his difficulty with sleep and functioning.  Patient denies any weakness or bowel or bladder changes.  Patient has no neck or back pain at this time.  Patient has no focal weakness or neurodeficits on exam.  Discussed importance of follow-up with orthopedics/neurosurgery for  further assessment and possible outpatient MRI. ? ?Blood work reviewed and reassuring normal white count, normal hemoglobin, liver function normal, normal lipase no signs of pancreatitis. ? ?Patient also feels ju

## 2021-12-26 NOTE — ED Triage Notes (Signed)
Patient was informed to come in for repeat vital signs.  ?

## 2021-12-26 NOTE — ED Provider Notes (Signed)
While completing patient's chart noticed that oxygen saturation prior to discharge was 85 to 86% recorded.  During exam patient had 94% oxygenation and normal work of breathing and overall unremarkable chest x-ray.  I was not aware of this hypoxia documentation.  I called the patient and the patient's significant other no answer.  Discussed with social work who was able to get a hold of patient's family/significant other and they will return to the emergency room for reassessment, vital signs to determine if patient needs to be admitted to the hospital. ? ?[name] ? ?  ?Alison Morrison, MD ?12/26/21 1554 ? ?

## 2021-12-26 NOTE — Telephone Encounter (Signed)
ED RNCM spoke with Dr. Reather Converse concerning patient's V/S. Requesting patient return for recheck of oxygen saturation.  Camera operator at [employer] made aware.  Notified patient who will be returning today.   ?

## 2021-12-26 NOTE — Discharge Instructions (Signed)
Continue to take the Prilosec as directed for a total of 2 weeks.  Make an appointment to follow-up with your primary care doctor.  To see if this helps the gastroesophageal reflux disease.  Also for the reflux would recommend propping yourself up with 2 pillows when asleep and you can supplement with Tums as needed.  Take the doxycycline as directed for the pneumonia until its completely resolved.  For your longstanding pain in your right leg make an appointment follow-up with neurology most likely needs an MRI.  He could also talk to your primary care doctor about getting an MRI for this. ? ?Return for any new or worse symptoms. ?

## 2021-12-26 NOTE — ED Notes (Signed)
Patient arrives for follow-up of low O2 at recent visit. Patient O2 89-91% on RA at rest. Patient has a documented COPD history, but denies any formal testing (PFT test). Patient has a smoking history, but is not on any respiratory medications for COPD per PCP notes. Patient is in no distress and no difficulty completing sentences at this time.  ?

## 2021-12-26 NOTE — ED Provider Notes (Signed)
?[facility] ?Provider Note ? ? ?CSN: [id number] ?Arrival date & time: 12/26/21  1642 ? ?  ? ?History ? ?Chief Complaint  ?Patient presents with  ? Follow-up  ? ? ?Alison Lewis is a 86 y.o. female. ? ?Patient seen on April 5.  Peers the patient got discharged and they noted that she had oxygen sats around 85%.  So they tried to contact her to get her to come back for recheck.  Were not able to get a hold of her.  Family member noted that there was lots of phone calls that were not being answered picked up on that today.  It was requesting for her to have a reevaluation for low oxygen sats when she was here on the fifth.  On the fifth patient was treated with doxycycline for atypical pneumonia.  Apparently was a discussion about what sounded like gastroesophageal reflux problems and they recommended an over-the-counter med patient is taking Prilosec for this.  Still having some difficulties at night.  But not sleeping propped up.  Not supplementing with any antacids like Tums.  Also patient brought up with concerned about a longstanding chronic pain in the right thigh area that she was told was due to a narrowing in her back.  Patient has not had an MRI recently.  Is not talked to her primary care doctor about this.  Patient states regard to breathing that she feels fine no shortness of breath.  Oxygen levels here ranged from 90 to 96% room air.  Patient not tachypneic.  Not febrile moving air well does not feel like she is wheezing.  Patient most likely has some COPD but never formally diagnosed.  Longstanding smoker and still smokes some. ? ? ?  ? ?Home Medications ?Prior to Admission medications   ?Medication Sig Start Date End Date Taking? Authorizing Provider  ?CALCIUM PO Take by mouth daily.    [provider]  ?doxycycline (VIBRAMYCIN) 100 MG capsule Take 1 capsule (100 mg total) by mouth 2 (two) times daily. One po bid x 7 days 12/24/21   Elnora Morrison, MD  ?DOXYCYCLINE  PO Take 20 g by mouth as directed. Prior to dental procedures    [provider]  ?naproxen sodium (ALEVE) 220 MG tablet Take 220 mg by mouth daily.    [provider]  ?NON FORMULARY Vitamin daily    [provider]  ?   ? ?Allergies    ?Patient has no known allergies.   ? ?Review of Systems   ?Review of Systems  ?Constitutional:  Negative for chills and fever.  ?HENT:  Negative for ear pain and sore throat.   ?Eyes:  Negative for pain and visual disturbance.  ?Respiratory:  Negative for cough and shortness of breath.   ?Cardiovascular:  Negative for chest pain and palpitations.  ?Gastrointestinal:  Negative for abdominal pain and vomiting.  ?Genitourinary:  Negative for dysuria and hematuria.  ?Musculoskeletal:  Negative for arthralgias and back pain.  ?Skin:  Negative for color change and rash.  ?Neurological:  Negative for seizures and syncope.  ?All other systems reviewed and are negative. ? ?Physical Exam ?Updated Vital Signs ?BP (!) 153/68 (BP Location: Right Arm)   Pulse 80   Temp 98.4 ?F (36.9 ?C)   Resp (!) 22   Ht 1.651 m ('5\' 5"'$ )   Wt 85.1 kg   SpO2 90%   BMI 31.22 kg/m?  ?Physical Exam ?Vitals and nursing note reviewed.  ?Constitutional:   ?  General: She is not in acute distress. ?   Appearance: Normal appearance. She is well-developed. She is not ill-appearing or toxic-appearing.  ?HENT:  ?   Head: Normocephalic and atraumatic.  ?Eyes:  ?   Conjunctiva/sclera: Conjunctivae normal.  ?Cardiovascular:  ?   Rate and Rhythm: Normal rate and regular rhythm.  ?   Heart sounds: No murmur heard. ?Pulmonary:  ?   Effort: Pulmonary effort is normal. No respiratory distress.  ?   Breath sounds: Normal breath sounds. No wheezing, rhonchi or rales.  ?Abdominal:  ?   Palpations: Abdomen is soft.  ?   Tenderness: There is no abdominal tenderness.  ?Musculoskeletal:     ?   General: No swelling.  ?   Cervical back: Neck supple.  ?Skin: ?   General: Skin is warm and dry.  ?   Capillary  Refill: Capillary refill takes less than 2 seconds.  ?Neurological:  ?   General: No focal deficit present.  ?   Mental Status: She is alert and oriented to person, place, and time.  ?   Cranial Nerves: No cranial nerve deficit.  ?   Sensory: No sensory deficit.  ?   Motor: No weakness.  ?Psychiatric:     ?   Mood and Affect: Mood normal.  ? ? ?ED Results / Procedures / Treatments   ?Labs ?(all labs ordered are listed, but only abnormal results are displayed) ?Labs Reviewed - No data to display ? ?EKG ?None ? ?Radiology ?No results found. ? ?Procedures ?Procedures  ? ? ?Medications Ordered in ED ?Medications - No data to display ? ?ED Course/ Medical Decision Making/ A&P ?  ?                        ?Medical Decision Making ? ?Patient in no respiratory distress here oxygen saturations are 90-96%.  No wheezing no significant tachypnea.  Patient is taking her doxycycline.  She will continue that.  She is taking Prilosec we will have her continue that for 2 weeks.  We will have her follow-up with her primary care doctor regarding the gastroesophageal reflux.  Also recommended she prop up with 2 pillows and that she could supplement with over-the-counter antacid like Tums.  Also patient regarding the longstanding right thigh pain that it was told was a pinched nerve can follow-up with her primary care doctor for that also given referral to [facility] neurology at her request. ? ?Patient nontoxic no acute distress can be discharged home.  Unfortunately patient had a long wait for this follow-up eval.  But overall she seems to be doing better than when she was seen on April 5. ? ? ?Final Clinical Impression(s) / ED Diagnoses ?Final diagnoses:  ?Follow-up exam  ? ? ?Rx / DC Orders ?ED Discharge Orders   ? ? None  ? ?  ? ? ?  ?Fredia Sorrow, MD ?12/26/21 1941 ? ?

## 2022-01-08 NOTE — Progress Notes (Signed)
?[facility] Note ? ?01/13/2022 ? ?  ? ?CHIEF COMPLAINT ?Patient presents for Retina Evaluation ? ? ?HISTORY OF PRESENT ILLNESS: ?Alison Lewis is a 86 y.o. female who presents to the clinic today for:  ? ?HPI   ? ? Retina Evaluation   ?In both eyes.  Associated Symptoms Floaters.  Negative for Flashes, Distortion, Blind Spot, Pain, Redness, Photophobia, Glare, Trauma, Scalp Tenderness, Jaw Claudication, Shoulder/Hip pain, Fever, Weight Loss and Fatigue.  Context:  near vision and reading.  Treatments tried include no treatments.  I, the attending physician,  performed the HPI with the patient and updated documentation appropriately. ? ?  ?  ? ? Comments   ?Patient referred by Dr. Raenette Rover for ARMD eval. Patient has been taking AREDS 2 since fall of 2022. Difficulty with reading, but reading glasses help. Denies distortion in vision.  ? ?  ?  ?Last edited by Bernarda Caffey, MD on 01/14/2022 12:50 PM.  ?  ?Pt is here on the referral of Dr. Raenette Rover for concern of ARMD OD, pt states she went in for a routine eye exam and did not realize her right eye vision was down, pt states the eye dr she used to see told her she had "young eyes" ? ?Referring physician: ?Adolph Pollack, MD ?[address] ?[place],  [place] [address] ? ?HISTORICAL INFORMATION:  ? ?Selected notes from the [facility] ?Referred by Dr. Raenette Rover ([facility]) ?LEE: 07/09/21 bs 20/50 OD, 20/20 OS  ?Ocular Hx- IOL OU ?PMH- none ?  ? ?CURRENT MEDICATIONS: ?No current outpatient medications on file. (Ophthalmic Drugs)  ? ?No current facility-administered medications for this visit. (Ophthalmic Drugs)  ? ?Current Outpatient Medications (Other)  ?Medication Sig  ? acetaminophen (TYLENOL) 325 MG tablet Take 325 mg by mouth in the morning and at bedtime.  ? naproxen sodium (ALEVE) 220 MG tablet Take 220 mg by mouth daily.  ? CALCIUM PO Take by mouth daily. (Patient not taking: Reported on 01/13/2022)  ?  doxycycline (VIBRAMYCIN) 100 MG capsule Take 1 capsule (100 mg total) by mouth 2 (two) times daily. One po bid x 7 days (Patient not taking: Reported on 01/13/2022)  ? DOXYCYCLINE PO Take 20 g by mouth as directed. Prior to dental procedures (Patient not taking: Reported on 01/13/2022)  ? NON FORMULARY Vitamin daily (Patient not taking: Reported on 01/13/2022)  ? ?No current facility-administered medications for this visit. (Other)  ? ?REVIEW OF SYSTEMS: ?ROS   ?Positive for: Eyes ?Negative for: Constitutional, Gastrointestinal, Neurological, Skin, Genitourinary, Musculoskeletal, HENT, Endocrine, Cardiovascular, Respiratory, Psychiatric, Allergic/Imm, Heme/Lymph ?Last edited by Roselee Nova D, COT on 01/13/2022  1:44 PM.  ?  ? ?ALLERGIES ?No Known Allergies ? ?PAST MEDICAL HISTORY ?Past Medical History:  ?Diagnosis Date  ? Allergy   ? Cholecystitis   ? Colon polyps   ? Hip bursitis   ? Lumbar spinal stenosis   ? Osteopenia   ? Shingles   ? Sinusitis   ? Tobacco abuse   ? ?Past Surgical History:  ?Procedure Laterality Date  ? CATARACT EXTRACTION    ? CATARACT EXTRACTION, BILATERAL    ? CHOLECYSTECTOMY    ? ?FAMILY HISTORY ?Family History  ?Problem Relation Age of Onset  ? Macular degeneration Father   ? ?SOCIAL HISTORY ?Social History  ? ?Tobacco Use  ? Smoking status: Every Day  ?  Packs/day: 0.50  ?  Types: Cigarettes  ? Smokeless tobacco: Never  ?  Tobacco comments:  ?  2-3 a day/ patients states to many years for how many years did you smoke  ?Vaping Use  ? Vaping Use: Never used  ?Substance Use Topics  ? Alcohol use: Not Currently  ? Drug use: Never  ?  ? ?  ?OPHTHALMIC EXAM: ? ?Base Eye Exam   ? ? Visual Acuity (Snellen - Linear)   ? ?   Right Left  ? Dist [place] 20/80 20/25  ? Dist ph [place] 20/60 NI  ? ?  ?  ? ? Tonometry (Tonopen, 1:52 PM)   ? ?   Right Left  ? Pressure 21 20  ? ?  ?  ? ? Pupils   ? ?   Dark Light Shape React APD  ? Right 3 2 Round Slow None  ? Left 3 2 Round Slow None  ? ?  ?  ? ? Visual Fields  (Counting fingers)   ? ?   Left Right  ?  Full Full  ? ?  ?  ? ? Extraocular Movement   ? ?   Right Left  ?  Full, Ortho Full, Ortho  ? ?  ?  ? ? Neuro/Psych   ? ? Oriented x3: Yes  ? Mood/Affect: Normal  ? ?  ?  ? ? Dilation   ? ? Both eyes: 1.0% Mydriacyl, 2.5% Phenylephrine @ 1:52 PM  ? ?  ?  ? ?  ? ?Slit Lamp and Fundus Exam   ? ? Slit Lamp Exam   ? ?   Right Left  ? Lids/Lashes Dermatochalasis - upper lid, Meibomian gland dysfunction Dermatochalasis - upper lid, Meibomian gland dysfunction  ? Conjunctiva/Sclera White and quiet White and quiet  ? Cornea mild arcus mild arcus  ? Anterior Chamber Deep and quiet Deep and quiet  ? Iris Round and moderately dilated Round and moderately dilated  ? Lens PC IOL in good position PC IOL in good position  ? Anterior Vitreous Vitreous syneresis Vitreous syneresis  ? ?  ?  ? ? Fundus Exam   ? ?   Right Left  ? Disc mild Pallor, Sharp rim, temporal PPA/PPP Pink and Sharp, Compact  ? C/D Ratio  0.3  ? Macula Blunted foveal reflex, Drusen, RPE mottling and clumping, central CNV with edema Flat, Blunted foveal reflex, Drusen, RPE mottling, No heme or edema  ? Vessels attenuated, Tortuous, AV crossing changes, mild copper wiring attenuated, Tortuous, mild AV crossing changes, copper wiring  ? Periphery Attached, reticular degeneration, No heme Attached, reticular degeneration, peripheral drusen, No heme  ? ?  ?  ? ?  ? ?Refraction   ? ? Manifest Refraction   ? ?   Sphere Cylinder Axis Dist [place]  ? Right -0.50 +1.00 180 20/70  ? Left -0.50 +0.50 158 20/20  ? ?  ?  ? ?  ? ?IMAGING AND PROCEDURES  ?Imaging and Procedures for 01/13/2022 ? ?OCT, Retina - OU - Both Eyes   ? ?   ?Right Eye ?Quality was good. Central Foveal Thickness: 310. Progression has no prior data. Findings include no IRF, subretinal fluid, abnormal foveal contour, pigment epithelial detachment, intraretinal hyper-reflective material, choroidal neovascular membrane, retinal drusen .  ? ?Left Eye ?Quality was good.  Central Foveal Thickness: 270. Progression has no prior data. Findings include normal foveal contour, no IRF, no SRF, retinal drusen (Focal patch of ORA superior to disc caught on widefield).  ? ?Notes ?*Images captured and stored on drive ? ?  Diagnosis / Impression:  ?OD: exu ARMD -- central CNV w/ overlying SRF ?OS: non-exu ARMD -- Focal patch of ORA superior to disc caught on widefield ? ?Clinical management:  ?See below ? ?Abbreviations: NFP - Normal foveal profile. CME - cystoid macular edema. PED - pigment epithelial detachment. IRF - intraretinal fluid. SRF - subretinal fluid. EZ - ellipsoid zone. ERM - epiretinal membrane. ORA - outer retinal atrophy. ORT - outer retinal tubulation. SRHM - subretinal hyper-reflective material. IRHM - intraretinal hyper-reflective material ? ? ?  ? ?Intravitreal Injection, Pharmacologic Agent - OD - Right Eye   ? ?   ?Time Out ?01/13/2022. 2:43 PM. Confirmed correct patient, procedure, site, and patient consented.  ? ?Anesthesia ?Topical anesthesia was used. Anesthetic medications included Lidocaine 2%, Proparacaine 0.5%.  ? ?Procedure ?Preparation included 5% betadine to ocular surface, eyelid speculum. A (32g) needle was used.  ? ?Injection: ?1.25 mg Bevacizumab 1.'25mg'$ /0.92m ?  Route: Intravitreal, Site: Right Eye ?  [place] [id number] Lot:: [id number] Expiration date: 03/16/2022  ? ?Post-op ?Post injection exam found visual acuity of at least counting fingers. The patient tolerated the procedure well. There were no complications. The patient received written and verbal post procedure care education. Post injection medications were not given.  ? ?  ?  ?  ? ?  ?ASSESSMENT/PLAN: ? ?  ICD-10-CM   ?1. Exudative age-related macular degeneration of right eye with active choroidal neovascularization (HCC)  H35.3211 OCT, Retina - OU - Both Eyes  ?  Intravitreal Injection, Pharmacologic Agent - OD - Right Eye  ?  Bevacizumab (AVASTIN) SOLN 1.25 mg  ?  ?2. Intermediate stage nonexudative  age-related macular degeneration of left eye  H35.3122   ?  ?3. Pseudophakia, both eyes  Z96.1   ?  ? ?Exudative age related macular degeneration, right eye ?- pt essentially asymptomatic and was unaware of any v

## 2022-01-13 ENCOUNTER — Encounter (INDEPENDENT_AMBULATORY_CARE_PROVIDER_SITE_OTHER): Payer: Self-pay | Admitting: Ophthalmology

## 2022-01-13 ENCOUNTER — Ambulatory Visit (INDEPENDENT_AMBULATORY_CARE_PROVIDER_SITE_OTHER): Payer: Medicare Other | Admitting: Ophthalmology

## 2022-01-13 DIAGNOSIS — H353211 Exudative age-related macular degeneration, right eye, with active choroidal neovascularization: Secondary | ICD-10-CM | POA: Diagnosis not present

## 2022-01-13 DIAGNOSIS — Z961 Presence of intraocular lens: Secondary | ICD-10-CM

## 2022-01-13 DIAGNOSIS — H353122 Nonexudative age-related macular degeneration, left eye, intermediate dry stage: Secondary | ICD-10-CM | POA: Diagnosis not present

## 2022-01-13 DIAGNOSIS — H3581 Retinal edema: Secondary | ICD-10-CM

## 2022-01-14 ENCOUNTER — Encounter (INDEPENDENT_AMBULATORY_CARE_PROVIDER_SITE_OTHER): Payer: Self-pay | Admitting: Ophthalmology

## 2022-01-14 DIAGNOSIS — H353211 Exudative age-related macular degeneration, right eye, with active choroidal neovascularization: Secondary | ICD-10-CM | POA: Diagnosis not present

## 2022-01-14 MED ORDER — BEVACIZUMAB CHEMO INJECTION 1.25MG/0.05ML SYRINGE FOR KALEIDOSCOPE
1.2500 mg | INTRAVITREAL | Status: AC | PRN
  Administered 2022-01-14: 1.25 mg via INTRAVITREAL

## 2022-01-23 ENCOUNTER — Ambulatory Visit
Admission: RE | Admit: 2022-01-23 | Discharge: 2022-01-23 | Disposition: A | Discharge status: Home / Self Care | Payer: Medicare Other | Source: Ambulatory Visit | Attending: Internal Medicine | Admitting: Internal Medicine

## 2022-01-23 DIAGNOSIS — Z1231 Encounter for screening mammogram for malignant neoplasm of breast: Secondary | ICD-10-CM

## 2022-01-28 ENCOUNTER — Ambulatory Visit (HOSPITAL_COMMUNITY)
Admission: RE | Admit: 2022-01-28 | Discharge: 2022-01-28 | Disposition: A | Discharge status: Home / Self Care | Payer: Medicare Other | Source: Ambulatory Visit | Attending: Internal Medicine | Admitting: Internal Medicine

## 2022-01-28 ENCOUNTER — Other Ambulatory Visit (HOSPITAL_COMMUNITY): Payer: Self-pay | Admitting: Internal Medicine

## 2022-01-28 DIAGNOSIS — I739 Peripheral vascular disease, unspecified: Secondary | ICD-10-CM | POA: Insufficient documentation

## 2022-01-30 NOTE — Progress Notes (Signed)
[facility] Note  02/10/2022    CHIEF COMPLAINT Patient presents for Retina Follow Up   HISTORY OF PRESENT ILLNESS: Alison Lewis is a 86 y.o. female who presents to the clinic today for:   HPI     Retina Follow Up   Patient presents with  Wet AMD.  In right eye.  This started 4 weeks ago.  I, the attending physician,  performed the HPI with the patient and updated documentation appropriately.        Comments   Patient here for 4 weeks retina follow up for exu ARMD OD. Patient states vision doing ok. No eye pain.      Last edited by Bernarda Caffey, MD on 02/10/2022  5:15 PM.    Pt states no problems after first injection, vision is stable  Referring physician: Michael Boston, MD [place],  [place] [address]  HISTORICAL INFORMATION:   Selected notes from the MEDICAL RECORD NUMBER Referred by Dr. Raenette Rover ([facility]) [place]: 07/09/21 bs 20/50 OD, 20/20 OS  Ocular Hx- IOL OU PMH- none    CURRENT MEDICATIONS: No current outpatient medications on file. (Ophthalmic Drugs)   No current facility-administered medications for this visit. (Ophthalmic Drugs)   Current Outpatient Medications (Other)  Medication Sig   acetaminophen (TYLENOL) 325 MG tablet Take 325 mg by mouth in the morning and at bedtime.   naproxen sodium (ALEVE) 220 MG tablet Take 220 mg by mouth daily.   CALCIUM PO Take by mouth daily. (Patient not taking: Reported on 01/13/2022)   doxycycline (VIBRAMYCIN) 100 MG capsule Take 1 capsule (100 mg total) by mouth 2 (two) times daily. One po bid x 7 days (Patient not taking: Reported on 01/13/2022)   DOXYCYCLINE PO Take 20 g by mouth as directed. Prior to dental procedures (Patient not taking: Reported on 01/13/2022)   NON FORMULARY Vitamin daily (Patient not taking: Reported on 01/13/2022)   No current facility-administered medications for this visit. (Other)   REVIEW OF SYSTEMS: ROS   Positive for: Eyes Negative for:  Constitutional, Gastrointestinal, Neurological, Skin, Genitourinary, Musculoskeletal, HENT, Endocrine, Cardiovascular, Respiratory, Psychiatric, Allergic/Imm, Heme/Lymph Last edited by Theodore Demark, COA on 02/10/2022  2:13 PM.     ALLERGIES No Known Allergies  PAST MEDICAL HISTORY Past Medical History:  Diagnosis Date   Allergy    Cholecystitis    Colon polyps    Hip bursitis    Lumbar spinal stenosis    Osteopenia    Shingles    Sinusitis    Tobacco abuse    Past Surgical History:  Procedure Laterality Date   CATARACT EXTRACTION     CATARACT EXTRACTION, BILATERAL     CHOLECYSTECTOMY     FAMILY HISTORY Family History  Problem Relation Age of Onset   Macular degeneration Father    Breast cancer Neg Hx    SOCIAL HISTORY Social History   Tobacco Use   Smoking status: Every Day    Packs/day: 0.50    Types: Cigarettes   Smokeless tobacco: Never   Tobacco comments:    2-3 a day/ patients states to many years for how many years did you smoke  Vaping Use   Vaping Use: Never used  Substance Use Topics   Alcohol use: Not Currently   Drug use: Never       OPHTHALMIC EXAM:  Base Eye Exam     Visual Acuity (Snellen - Linear)  Right Left   Dist [place] 20/60 -2 20/25   Dist ph [place] NI NI         Tonometry (Tonopen, 2:11 PM)       Right Left   Pressure 21 20         Pupils       Dark Light Shape React APD   Right 3 2 Round Slow None   Left 3 2 Round Slow None         Visual Fields (Counting fingers)       Left Right    Full Full         Extraocular Movement       Right Left    Full, Ortho Full, Ortho         Neuro/Psych     Oriented x3: Yes   Mood/Affect: Normal         Dilation     Both eyes: 1.0% Mydriacyl, 2.5% Phenylephrine @ 2:11 PM           Slit Lamp and Fundus Exam     Slit Lamp Exam       Right Left   Lids/Lashes Dermatochalasis - upper lid, Meibomian gland dysfunction Dermatochalasis - upper lid,  Meibomian gland dysfunction   Conjunctiva/Sclera White and quiet White and quiet   Cornea mild arcus mild arcus   Anterior Chamber Deep and quiet Deep and quiet   Iris Round and moderately dilated Round and moderately dilated   Lens PC IOL in good position PC IOL in good position   Anterior Vitreous Vitreous syneresis, Posterior vitreous detachment, vitreous condensations Vitreous syneresis         Fundus Exam       Right Left   Disc mild Pallor, Sharp rim, temporal PPA/PPP, Compact Pink and Sharp, Compact   C/D Ratio 0.2 0.3   Macula Blunted foveal reflex, Drusen, RPE mottling and clumping, central CNV with edema - improved Flat, good foveal reflex, Drusen, RPE mottling, No heme or edema   Vessels attenuated, Tortuous, AV crossing changes, mild copper wiring attenuated, Tortuous, mild AV crossing changes, copper wiring   Periphery Attached, reticular degeneration, No heme Attached, reticular degeneration, peripheral drusen, No heme           IMAGING AND PROCEDURES  Imaging and Procedures for 02/10/2022  OCT, Retina - OU - Both Eyes       Right Eye Quality was good. Central Foveal Thickness: 287. Progression has improved. Findings include no IRF, subretinal fluid, abnormal foveal contour, pigment epithelial detachment, intraretinal hyper-reflective material, choroidal neovascular membrane, retinal drusen (Interval improvement in SRF and foveal contour overlying central PED / CNV).   Left Eye Quality was good. Central Foveal Thickness: 227. Progression has been stable. Findings include normal foveal contour, no IRF, no SRF, retinal drusen (Focal patch of ORA superior to disc caught on widefield).   Notes *Images captured and stored on drive  Diagnosis / Impression:  OD: exu ARMD -- Interval improvement in SRF and foveal contour overlying central PED / CNV OS: non-exu ARMD -- Focal patch of ORA superior to disc caught on widefield  Clinical management:  See  below  Abbreviations: NFP - Normal foveal profile. CME - cystoid macular edema. PED - pigment epithelial detachment. IRF - intraretinal fluid. SRF - subretinal fluid. EZ - ellipsoid zone. ERM - epiretinal membrane. ORA - outer retinal atrophy. ORT - outer retinal tubulation. SRHM - subretinal hyper-reflective material. IRHM - intraretinal hyper-reflective material  Intravitreal Injection, Pharmacologic Agent - OD - Right Eye       Time Out 02/10/2022. 2:58 PM. Confirmed correct patient, procedure, site, and patient consented.   Anesthesia Topical anesthesia was used. Anesthetic medications included Lidocaine 2%, Proparacaine 0.5%.   Procedure Preparation included 5% betadine to ocular surface, eyelid speculum. A supplied (32g) needle was used.   Injection: 1.25 mg Bevacizumab 1.'25mg'$ /0.7m   Route: Intravitreal, Site: Right Eye   NDC: [id number] Lot: [id number]'@[id number]'$ , Expiration date: 04/01/2022   Post-op Post injection exam found visual acuity of at least counting fingers. The patient tolerated the procedure well. There were no complications. The patient received written and verbal post procedure care education. Post injection medications were not given.            ASSESSMENT/PLAN:    ICD-10-CM   1. Exudative age-related macular degeneration of right eye with active choroidal neovascularization (HCC)  H35.3211 OCT, Retina - OU - Both Eyes    Intravitreal Injection, Pharmacologic Agent - OD - Right Eye    Bevacizumab (AVASTIN) SOLN 1.25 mg    2. Intermediate stage nonexudative age-related macular degeneration of left eye  H35.3122 OCT, Retina - OU - Both Eyes    3. Pseudophakia, both eyes  Z96.1      Exudative age related macular degeneration, right eye - pt essentially asymptomatic and was unaware of any vision changes OD -- incidental finding on routine eye exam w/ Dr. D20/08/2022- s/p IVA OD #1 (04.25.23) - BCVA stable at 20/60 OD  - OCT shows interval improvement in SRF  and foveal contour overlying central PED / CNV  - recommend IVA OD #2 today, on 5.23.23   - RBA of procedure discussed, questions answered - IVA informed consent obtained and signed 04.25.23 - see procedure note  - f/u in 4 wks, DFE, OCT, possible injection  2. Age related macular degeneration, non-exudative, left eye  - The incidence, anatomy, and pathology of dry AMD, risk of progression, and the AREDS and AREDS 2 study including smoking risks discussed with patient.  - Recommend amsler grid monitoring  3. Pseudophakia OU  - s/p CE/IOL OU (Dr. B05.08.23  - IOL in good position, doing well  - monitor  Ophthalmic Meds Ordered this visit:  Meds ordered this encounter  Medications   Bevacizumab (AVASTIN) SOLN 1.25 mg     Return in about 4 weeks (around 03/10/2022) for f/u exu ARMD OD, DFE, OCT.  There are no Patient Instructions on file for this visit.   Explained the diagnoses, plan, and follow up with the patient and they expressed understanding.  Patient expressed understanding of the importance of proper follow up care.   This document serves as a record of services personally performed by B7/11/2021 MD, PhD. It was created on their behalf by BGardiner Sleeper MD, an ophthalmic technician. The creation of this record is the provider's dictation and/or activities during the visit.    Electronically signed by: BBernarda Caffey MD 01/30/22 5:19 PM   B18/12/23 M.D., Ph.D. Diseases & Surgery of the Retina and Vitreous [facility] I have reviewed the above documentation for accuracy and completeness, and I agree with the above. B3Er [facility] M.D., Ph.D. 02/10/22 5:19 PM   Abbreviations: M myopia (nearsighted); A astigmatism; H hyperopia (farsighted); P presbyopia; Mrx spectacle prescription;  CTL contact lenses; OD right eye; OS left eye; OU both eyes  XT exotropia; ET esotropia; PEK punctate epithelial keratitis; PEE punctate  epithelial erosions; DES  dry eye syndrome; MGD meibomian gland dysfunction; ATs artificial tears; PFAT's preservative free artificial tears; [place] nuclear sclerotic cataract; PSC posterior subcapsular cataract; ERM epi-retinal membrane; PVD posterior vitreous detachment; RD retinal detachment; DM diabetes mellitus; DR diabetic retinopathy; NPDR non-proliferative diabetic retinopathy; PDR proliferative diabetic retinopathy; CSME clinically significant macular edema; DME diabetic macular edema; dbh dot blot hemorrhages; CWS cotton wool spot; POAG primary open angle glaucoma; C/D cup-to-disc ratio; HVF humphrey visual field; GVF goldmann visual field; OCT optical coherence tomography; IOP intraocular pressure; BRVO Branch retinal vein occlusion; CRVO central retinal vein occlusion; CRAO central retinal artery occlusion; BRAO branch retinal artery occlusion; RT retinal tear; SB scleral buckle; PPV pars plana vitrectomy; VH Vitreous hemorrhage; PRP panretinal laser photocoagulation; IVK intravitreal kenalog; VMT vitreomacular traction; MH Macular hole;  NVD neovascularization of the disc; NVE neovascularization elsewhere; AREDS age related eye disease study; ARMD age related macular degeneration; POAG primary open angle glaucoma; EBMD epithelial/anterior basement membrane dystrophy; ACIOL anterior chamber intraocular lens; IOL intraocular lens; PCIOL posterior chamber intraocular lens; Phaco/IOL phacoemulsification with intraocular lens placement; [place] photorefractive keratectomy; LASIK laser assisted in situ keratomileusis; HTN hypertension; DM diabetes mellitus; COPD chronic obstructive pulmonary disease

## 2022-02-10 ENCOUNTER — Encounter (INDEPENDENT_AMBULATORY_CARE_PROVIDER_SITE_OTHER): Payer: Self-pay | Admitting: Ophthalmology

## 2022-02-10 ENCOUNTER — Ambulatory Visit (INDEPENDENT_AMBULATORY_CARE_PROVIDER_SITE_OTHER): Payer: Medicare Other | Admitting: Ophthalmology

## 2022-02-10 DIAGNOSIS — Z961 Presence of intraocular lens: Secondary | ICD-10-CM | POA: Diagnosis not present

## 2022-02-10 DIAGNOSIS — H353122 Nonexudative age-related macular degeneration, left eye, intermediate dry stage: Secondary | ICD-10-CM

## 2022-02-10 DIAGNOSIS — H353211 Exudative age-related macular degeneration, right eye, with active choroidal neovascularization: Secondary | ICD-10-CM | POA: Diagnosis not present

## 2022-02-10 MED ORDER — BEVACIZUMAB CHEMO INJECTION 1.25MG/0.05ML SYRINGE FOR KALEIDOSCOPE
1.2500 mg | INTRAVITREAL | Status: AC | PRN
  Administered 2022-02-10: 1.25 mg via INTRAVITREAL

## 2022-02-16 NOTE — Progress Notes (Unsigned)
[facility] [place]  ASSESSMENT / PLAN: Alison Lewis is a 86 y.o. female with atherosclerosis of native arteries of bilateral lower extremities. She also has spinal stenosis.  I think both problems are contributing to her lower extremity pain.  Recommend the following which can slow the progression of atherosclerosis and reduce the risk of major adverse cardiac / limb events:  Complete cessation from all tobacco products. Blood glucose control with goal A1c < 7%. Blood pressure control with goal blood pressure < 140/90 mmHg. Lipid reduction therapy with goal LDL-C <100 mg/dL (<70 if symptomatic from PAD).  Aspirin '81mg'$  PO QD.  Atorvastatin 40-'80mg'$  PO QD (or other "high intensity" statin therapy). Daily walking to and past the point of discomfort. Patient counseled to keep a log of exercise distance.  At the moment, I would recommend medical management only of her peripheral arterial disease.  I would only consider intervention for should she stop smoking and work on a walking regimen.  Follow-up with me in 6 months  CHIEF COMPLAINT: Bilateral lower extremity pain  HISTORY OF PRESENT ILLNESS: Alison Lewis is a 86 y.o. female who presents to clinic for evaluation of bilateral lower extremity discomfort.  The patient reports her pain is present when she stands for prolonged period of time or when she walks.  The pain is not relieved by stopping walking, but is relieved by changing position.  She does not have symptoms typical of intermittent claudication.  She has no ischemic rest pain.  She has no ulcers about her feet.  Unfortunately, she was widowed in the last months.  She was married to her husband for 60 years.,  She is having a hard time adjusting to life without her husband.  Past Medical History:  Diagnosis Date   Allergy    Cholecystitis    Colon polyps    Hip bursitis    Lumbar spinal stenosis    Osteopenia    Shingles    Sinusitis    Tobacco  abuse     Past Surgical History:  Procedure Laterality Date   CATARACT EXTRACTION     CATARACT EXTRACTION, BILATERAL     CHOLECYSTECTOMY      Family History  Problem Relation Age of Onset   Macular degeneration Father    Breast cancer Neg Hx     Social History   Socioeconomic History   Marital status: Widowed    Spouse name: Jeneen Rinks    Number of children: 2   Years of education: Degree    Highest education level: Associate degree: academic program  Occupational History   Not on file  Tobacco Use   Smoking status: Every Day    Packs/day: 0.25    Types: Cigarettes   Smokeless tobacco: Never   Tobacco comments:    2-3 a day/ patients states to many years for how many years did you smoke  Vaping Use   Vaping Use: Never used  Substance and Sexual Activity   Alcohol use: Not Currently   Drug use: Never   Sexual activity: Not Currently  Other Topics Concern   Not on file  Social History Narrative   Diet: Left Blank      Do you drink/ eat things with caffeine? Yes      Marital status:    M                           What year were  you married ? 1962      Do you live in a house, apartment,assistred living, condo, trailer, etc.)? House      Is it one or more stories? Yes      How many persons live in your home ? 2      Do you have any pets in your home ?(please list)  No      Highest Level of education completed: BS Home      Current or past profession:  Pharmacist, hospital, Retail, Homemaker      Do you exercise?    No                          Type & how often       ADVANCED DIRECTIVES (Please bring copies)      Do you have a living will?  Yes      Do you have a DNR form?   Yes                    If not, do you want to discuss one?       Do you have signed POA?HPOA forms?    Yes             If so, please bring to your appointment      FUNCTIONAL STATUS- To be completed by Spouse / child / Staff       Do you have difficulty bathing or dressing yourself ? No      Do  you have difficulty preparing food or eating ?  Yes      Do you have difficulty managing your mediation ?  No      Do you have difficulty managing your finances ? No      Do you have difficulty affording your medication ?  No      Social Determinants of Radio broadcast assistant Strain: Not on file  Food Insecurity: Not on file  Transportation Needs: Not on file  Physical Activity: Not on file  Stress: Not on file  Social Connections: Not on file  Intimate Partner Violence: Not on file    No Known Allergies  Current Outpatient Medications  Medication Sig Dispense Refill   acetaminophen (TYLENOL) 325 MG tablet Take 325 mg by mouth in the morning and at bedtime.     gabapentin (NEURONTIN) 100 MG capsule 1 capsule Orally Once a day at bedtime for nerve pain     naproxen sodium (ALEVE) 220 MG tablet Take 220 mg by mouth daily.     rosuvastatin (CRESTOR) 20 MG tablet 1 tablet Orally Once a day at bedtime for cholesterol     CALCIUM PO Take by mouth daily. (Patient not taking: Reported on 01/13/2022)     DOXYCYCLINE PO Take 20 g by mouth as directed. Prior to dental procedures (Patient not taking: Reported on 01/13/2022)     NON FORMULARY Vitamin daily (Patient not taking: Reported on 01/13/2022)     No current facility-administered medications for this visit.    PHYSICAL EXAM Vitals:   02/17/22 1252  BP: (!) 148/85  Pulse: 95  Resp: 20  Temp: 97.7 F (36.5 C)  SpO2: 93%  Weight: 160 lb (72.6 kg)  Height: '5\' 5"'$  (1.651 m)    Constitutional: Thoroughly woman in no acute distress Cardiac: Regular rate and rhythm Respiratory: Unlabored breathing Abdominal: Nondistended abdomen Peripheral vascular: No palpable pedal pulses  PERTINENT LABORATORY AND  RADIOLOGIC DATA  Most recent CBC    Latest Ref Rng & Units 12/24/2021   11:39 AM 06/13/2020   10:43 AM  CBC  WBC 4.0 - 10.5 K/uL 9.7   6.9    Hemoglobin 12.0 - 15.0 g/dL 13.9   15.9    Hematocrit 36.0 - 46.0 % 42.7   49.1     Platelets 150 - 400 K/uL 360   304       Most recent CMP    Latest Ref Rng & Units 12/24/2021   11:39 AM 06/13/2020   10:43 AM  CMP  Glucose 70 - 99 mg/dL 104   120    BUN 8 - 23 mg/dL 23   16    Creatinine 0.44 - 1.00 mg/dL 0.85   0.90    Sodium 135 - 145 mmol/L 138   142    Potassium 3.5 - 5.1 mmol/L 4.2   4.9    Chloride 98 - 111 mmol/L 100   102    CO2 22 - 32 mmol/L 25   32    Calcium 8.9 - 10.3 mg/dL 9.5   9.6    Total Protein 6.5 - 8.1 g/dL 7.2   6.8    Total Bilirubin 0.3 - 1.2 mg/dL 0.7   0.8    Alkaline Phos 38 - 126 U/L 67     AST 15 - 41 U/L 13   19    ALT 0 - 44 U/L 10   18      Renal function CrCl cannot be calculated (Patient's most recent lab result is older than the maximum 21 days allowed.).  Hgb A1c MFr Bld (% of total Hgb)  Date Value  06/13/2020 6.2 (H)    LDL Cholesterol (Calc)  Date Value Ref Range Status  06/13/2020 113 (H) mg/dL (calc) Final    Comment:    Reference range: <100 . Desirable range <100 mg/dL for primary prevention;   <70 mg/dL for patients with CHD or diabetic patients  with > or = 2 CHD risk factors. Marland Kitchen LDL-C is now calculated using the Martin-Hopkins  calculation, which is a validated novel method providing  better accuracy than the Friedewald equation in the  estimation of LDL-C.  Cresenciano Genre et al. Annamaria Helling. 3474;259(56): 2061-2068  (http://education.[employer].com/faq/FAQ164)       +-------+-----------+-----------+------------+------------+  ABI/TBIToday's ABIToday's TBIPrevious ABIPrevious TBI  +-------+-----------+-----------+------------+------------+  Right  0.56       0.34                                 +-------+-----------+-----------+------------+------------+  Left   0.60       0.26                                 +-------+-----------+-----------+------------+------------+   Yevonne Aline. Stanford Breed, MD [facility] Phone Number: [phone] 02/17/2022  5:08 PM  Total time spent on preparing this encounter including chart review, data review, collecting history, examining the patient, coordinating care for this new patient, 45 minutes.  Portions of this report may have been transcribed using voice recognition software.  Every effort has been made to ensure accuracy; however, inadvertent computerized transcription errors may still be present.

## 2022-02-17 ENCOUNTER — Encounter: Payer: Self-pay | Admitting: Vascular Surgery

## 2022-02-17 ENCOUNTER — Ambulatory Visit (INDEPENDENT_AMBULATORY_CARE_PROVIDER_SITE_OTHER): Payer: Medicare Other | Admitting: Vascular Surgery

## 2022-02-17 VITALS — BP 148/85 | HR 95 | Temp 97.7°F | Resp 20 | Ht 65.0 in | Wt 160.0 lb

## 2022-02-17 DIAGNOSIS — I739 Peripheral vascular disease, unspecified: Secondary | ICD-10-CM

## 2022-02-20 ENCOUNTER — Other Ambulatory Visit: Payer: Self-pay | Admitting: *Deleted

## 2022-02-20 DIAGNOSIS — I739 Peripheral vascular disease, unspecified: Secondary | ICD-10-CM

## 2022-02-25 ENCOUNTER — Other Ambulatory Visit: Payer: Self-pay | Admitting: Orthopaedic Surgery

## 2022-02-25 DIAGNOSIS — M48062 Spinal stenosis, lumbar region with neurogenic claudication: Secondary | ICD-10-CM

## 2022-03-10 NOTE — Progress Notes (Signed)
[facility] Note  03/11/2022    CHIEF COMPLAINT Patient presents for Retina Follow Up   HISTORY OF PRESENT ILLNESS: Alison Lewis is a 86 y.o. female who presents to the clinic today for:   HPI     Retina Follow Up   Patient presents with  Wet AMD.  In right eye.  This started 4 weeks ago.  I, the attending physician,  performed the HPI with the patient and updated documentation appropriately.        Comments   Patient here for 4 weeks retina follow up for exu ARMD OD. Patient states vision about the same. No eye pain.       Last edited by Bernarda Caffey, MD on 03/11/2022  5:02 PM.    Patient states, no big improvement in vision since last injection. Denies any problems after injection.   Referring physician: Michael Boston, MD [place],  [place] [address]  HISTORICAL INFORMATION:   Selected notes from the MEDICAL RECORD NUMBER Referred by Dr. Raenette Rover ([facility]) [place]: 07/09/21 bs 20/50 OD, 20/20 OS  Ocular Hx- IOL OU PMH- none    CURRENT MEDICATIONS: No current outpatient medications on file. (Ophthalmic Drugs)   No current facility-administered medications for this visit. (Ophthalmic Drugs)   Current Outpatient Medications (Other)  Medication Sig   acetaminophen (TYLENOL) 325 MG tablet Take 325 mg by mouth in the morning and at bedtime.   gabapentin (NEURONTIN) 100 MG capsule 1 capsule Orally Once a day at bedtime for nerve pain   naproxen sodium (ALEVE) 220 MG tablet Take 220 mg by mouth daily.   rosuvastatin (CRESTOR) 20 MG tablet 1 tablet Orally Once a day at bedtime for cholesterol   CALCIUM PO Take by mouth daily. (Patient not taking: Reported on 01/13/2022)   DOXYCYCLINE PO Take 20 g by mouth as directed. Prior to dental procedures (Patient not taking: Reported on 01/13/2022)   NON FORMULARY Vitamin daily (Patient not taking: Reported on 01/13/2022)   No current facility-administered medications for this visit.  (Other)   REVIEW OF SYSTEMS: ROS   Positive for: Eyes Negative for: Constitutional, Gastrointestinal, Neurological, Skin, Genitourinary, Musculoskeletal, HENT, Endocrine, Cardiovascular, Respiratory, Psychiatric, Allergic/Imm, Heme/Lymph Last edited by Theodore Demark, COA on 03/11/2022  2:43 PM.     ALLERGIES No Known Allergies  PAST MEDICAL HISTORY Past Medical History:  Diagnosis Date   Allergy    Cholecystitis    Colon polyps    Hip bursitis    Lumbar spinal stenosis    Osteopenia    Shingles    Sinusitis    Tobacco abuse    Past Surgical History:  Procedure Laterality Date   CATARACT EXTRACTION     CATARACT EXTRACTION, BILATERAL     CHOLECYSTECTOMY     FAMILY HISTORY Family History  Problem Relation Age of Onset   Macular degeneration Father    Breast cancer Neg Hx    SOCIAL HISTORY Social History   Tobacco Use   Smoking status: Every Day    Packs/day: 0.25    Types: Cigarettes   Smokeless tobacco: Never   Tobacco comments:    2-3 a day/ patients states to many years for how many years did you smoke  Vaping Use   Vaping Use: Never used  Substance Use Topics   Alcohol use: Not Currently   Drug use: Never       OPHTHALMIC EXAM:  Base Eye Exam  Visual Acuity (Snellen - Linear)       Right Left   Dist [place] 20/60 20/25   Dist ph  NI NI         Tonometry (Tonopen, 2:41 PM)       Right Left   Pressure 17 18         Pupils       Dark Light Shape React APD   Right 3 2 Round Slow None   Left 3 2 Round Slow None         Visual Fields (Counting fingers)       Left Right    Full Full         Extraocular Movement       Right Left    Full, Ortho Full, Ortho         Neuro/Psych     Oriented x3: Yes   Mood/Affect: Normal         Dilation     Both eyes: 1.0% Mydriacyl, 2.5% Phenylephrine @ 2:41 PM           Slit Lamp and Fundus Exam     Slit Lamp Exam       Right Left   Lids/Lashes Dermatochalasis -  upper lid, Meibomian gland dysfunction Dermatochalasis - upper lid, Meibomian gland dysfunction   Conjunctiva/Sclera White and quiet White and quiet   Cornea mild arcus mild arcus   Anterior Chamber Deep and quiet Deep and quiet   Iris Round and moderately dilated Round and moderately dilated   Lens PC IOL in good position PC IOL in good position   Anterior Vitreous Vitreous syneresis, Posterior vitreous detachment, vitreous condensations Vitreous syneresis         Fundus Exam       Right Left   Disc mild Pallor, Sharp rim, temporal PPA/PPP, Compact Pink and Sharp, Compact   C/D Ratio 0.2 0.3   Macula Blunted foveal reflex, Drusen, RPE mottling and clumping, central CNV with edema - improved, shallow SRF Flat, good foveal reflex, Drusen, RPE mottling, No heme or edema   Vessels attenuated, Tortuous, AV crossing changes, mild copper wiring attenuated, Tortuous, mild AV crossing changes, copper wiring   Periphery Attached, reticular degeneration, No heme Attached, reticular degeneration, peripheral drusen, No heme           IMAGING AND PROCEDURES  Imaging and Procedures for 03/11/2022  OCT, Retina - OU - Both Eyes       Right Eye Quality was good. Central Foveal Thickness: 286. Progression has improved. Findings include no IRF, abnormal foveal contour, retinal drusen , intraretinal hyper-reflective material, choroidal neovascular membrane, pigment epithelial detachment, subretinal fluid (Mild Interval improvement in SRF and foveal contour overlying central PED / CNV).   Left Eye Quality was good. Central Foveal Thickness: 273. Progression has been stable. Findings include normal foveal contour, no IRF, no SRF, retinal drusen (Focal patch of ORA superior to disc caught on widefield, trace ERM).   Notes *Images captured and stored on drive  Diagnosis / Impression:  OD: exu ARMD -- Mild interval improvement in SRF and foveal contour overlying central PED / CNV OS: non-exu ARMD --  Focal patch of ORA superior to disc caught on widefield, trace ERM  Clinical management:  See below  Abbreviations: NFP - Normal foveal profile. CME - cystoid macular edema. PED - pigment epithelial detachment. IRF - intraretinal fluid. SRF - subretinal fluid. EZ - ellipsoid zone. ERM - epiretinal membrane. ORA - outer retinal  atrophy. ORT - outer retinal tubulation. SRHM - subretinal hyper-reflective material. IRHM - intraretinal hyper-reflective material      Intravitreal Injection, Pharmacologic Agent - OD - Right Eye       Time Out 03/11/2022. 3:39 PM. Confirmed correct patient, procedure, site, and patient consented.   Anesthesia Topical anesthesia was used. Anesthetic medications included Lidocaine 2%, Proparacaine 0.5%.   Procedure Preparation included 5% betadine to ocular surface, eyelid speculum. A (32g) needle was used.   Injection: 1.25 mg Bevacizumab 1.'25mg'$ /0.51m   Route: Intravitreal, Site: Right Eye   NDC: [id number] Lot:: [id number] Expiration date: 05/18/2022   Post-op Post injection exam found visual acuity of at least counting fingers. The patient tolerated the procedure well. There were no complications. The patient received written and verbal post procedure care education. Post injection medications were not given.            ASSESSMENT/PLAN:    ICD-10-CM   1. Exudative age-related macular degeneration of right eye with active choroidal neovascularization (HCC)  H35.3211 OCT, Retina - OU - Both Eyes    Intravitreal Injection, Pharmacologic Agent - OD - Right Eye    Bevacizumab (AVASTIN) SOLN 1.25 mg    2. Intermediate stage nonexudative age-related macular degeneration of left eye  H35.3122     3. Pseudophakia, both eyes  Z96.1      Exudative age related macular degeneration, right eye - pt essentially asymptomatic and was unaware of any vision changes OD -- incidental finding on routine eye exam w/ Dr. DRosana Hoes- s/p IVA OD #1 (04.25.23), #2  (05.23.23) - BCVA stable at 20/60 OD  - OCT shows mild interval improvement in SRF and foveal contour overlying central PED / CNV  - recommend IVA OD #3 today, on 5.23.23   - RBA of procedure discussed, questions answered - IVA informed consent obtained and signed 04.25.23 (OD) - see procedure note  - f/u in 4 wks, DFE, OCT, possible injection  2. Age related macular degeneration, non-exudative, left eye  - The incidence, anatomy, and pathology of dry AMD, risk of progression, and the AREDS and AREDS 2 study including smoking risks discussed with patient.  - Recommend amsler grid monitoring  3. Pseudophakia OU  - s/p CE/IOL OU (Dr. BTalbert Forest  - IOL in good position, doing well  - monitor  Ophthalmic Meds Ordered this visit:  Meds ordered this encounter  Medications   Bevacizumab (AVASTIN) SOLN 1.25 mg     Return in about 4 weeks (around 04/08/2022) for DFE, OCT, possible injection.  There are no Patient Instructions on file for this visit.   Explained the diagnoses, plan, and follow up with the patient and they expressed understanding.  Patient expressed understanding of the importance of proper follow up care.   This document serves as a record of services personally performed by BGardiner Sleeper MD, PhD. It was created on their behalf by BBernarda Caffey MD, an ophthalmic technician. The creation of this record is the provider's dictation and/or activities during the visit.    Electronically signed by: BBernarda Caffey MD 01/30/22 5:03 PM   This document serves as a record of services personally performed by BGardiner Sleeper MD, PhD. It was created on their behalf by RLeonie Douglas an ophthalmic technician. The creation of this record is the provider's dictation and/or activities during the visit.    Electronically signed by: RLeonie DouglasCOA, 03/11/22  5:03 PM  BGardiner Sleeper M.D., Ph.D. Diseases & Surgery of the Retina and  Vitreous [facility]  I have  reviewed the above documentation for accuracy and completeness, and I agree with the above. Gardiner Sleeper, M.D., Ph.D. 03/11/22 5:04 PM   Abbreviations: M myopia (nearsighted); A astigmatism; H hyperopia (farsighted); P presbyopia; Mrx spectacle prescription;  CTL contact lenses; OD right eye; OS left eye; OU both eyes  XT exotropia; ET esotropia; PEK punctate epithelial keratitis; PEE punctate epithelial erosions; DES dry eye syndrome; MGD meibomian gland dysfunction; ATs artificial tears; PFAT's preservative free artificial tears; [place] nuclear sclerotic cataract; PSC posterior subcapsular cataract; ERM epi-retinal membrane; PVD posterior vitreous detachment; RD retinal detachment; DM diabetes mellitus; DR diabetic retinopathy; NPDR non-proliferative diabetic retinopathy; PDR proliferative diabetic retinopathy; CSME clinically significant macular edema; DME diabetic macular edema; dbh dot blot hemorrhages; CWS cotton wool spot; POAG primary open angle glaucoma; C/D cup-to-disc ratio; HVF humphrey visual field; GVF goldmann visual field; OCT optical coherence tomography; IOP intraocular pressure; BRVO Branch retinal vein occlusion; CRVO central retinal vein occlusion; CRAO central retinal artery occlusion; BRAO branch retinal artery occlusion; RT retinal tear; SB scleral buckle; PPV pars plana vitrectomy; VH Vitreous hemorrhage; PRP panretinal laser photocoagulation; IVK intravitreal kenalog; VMT vitreomacular traction; MH Macular hole;  NVD neovascularization of the disc; NVE neovascularization elsewhere; AREDS age related eye disease study; ARMD age related macular degeneration; POAG primary open angle glaucoma; EBMD epithelial/anterior basement membrane dystrophy; ACIOL anterior chamber intraocular lens; IOL intraocular lens; PCIOL posterior chamber intraocular lens; Phaco/IOL phacoemulsification with intraocular lens placement; [place] photorefractive keratectomy; LASIK laser assisted in situ keratomileusis;  HTN hypertension; DM diabetes mellitus; COPD chronic obstructive pulmonary disease

## 2022-03-11 ENCOUNTER — Ambulatory Visit (INDEPENDENT_AMBULATORY_CARE_PROVIDER_SITE_OTHER): Payer: Medicare Other | Admitting: Ophthalmology

## 2022-03-11 ENCOUNTER — Encounter (INDEPENDENT_AMBULATORY_CARE_PROVIDER_SITE_OTHER): Payer: Self-pay | Admitting: Ophthalmology

## 2022-03-11 DIAGNOSIS — Z961 Presence of intraocular lens: Secondary | ICD-10-CM

## 2022-03-11 DIAGNOSIS — H353211 Exudative age-related macular degeneration, right eye, with active choroidal neovascularization: Secondary | ICD-10-CM | POA: Diagnosis not present

## 2022-03-11 DIAGNOSIS — H353122 Nonexudative age-related macular degeneration, left eye, intermediate dry stage: Secondary | ICD-10-CM

## 2022-03-11 MED ORDER — BEVACIZUMAB CHEMO INJECTION 1.25MG/0.05ML SYRINGE FOR KALEIDOSCOPE
1.2500 mg | INTRAVITREAL | Status: AC | PRN
  Administered 2022-03-11: 1.25 mg via INTRAVITREAL

## 2022-03-13 ENCOUNTER — Ambulatory Visit
Admission: RE | Admit: 2022-03-13 | Discharge: 2022-03-13 | Disposition: A | Discharge status: Home / Self Care | Payer: Medicare Other | Source: Ambulatory Visit | Attending: Orthopaedic Surgery | Admitting: Orthopaedic Surgery

## 2022-03-13 DIAGNOSIS — M48062 Spinal stenosis, lumbar region with neurogenic claudication: Secondary | ICD-10-CM

## 2022-04-02 NOTE — Progress Notes (Signed)
[facility] Note  04/08/2022    CHIEF COMPLAINT Patient presents for Retina Follow Up   HISTORY OF PRESENT ILLNESS: Alison Lewis is a 86 y.o. female who presents to the clinic today for:   HPI     Retina Follow Up   Patient presents with  Wet AMD (IVA 06.21.23).  In right eye.  This started months ago.  Duration of 4 weeks.  Since onset it is stable.  I, the attending physician,  performed the HPI with the patient and updated documentation appropriately.        Comments   Patient feels that the vision is the same.       Last edited by Bernarda Caffey, MD on 04/08/2022  4:42 PM.    Pt feels like her vision has a "coating"over it that she has been unable to get rid of today  Referring physician: Michael Boston, MD [place],  [place] [address]  HISTORICAL INFORMATION:   Selected notes from the [place] Referred by Dr. Raenette Rover ([facility]) Truman Hayward: 07/09/21 bs 20/50 OD, 20/20 OS  Ocular Hx- IOL OU PMH- none    CURRENT MEDICATIONS: No current outpatient medications on file. (Ophthalmic Drugs)   No current facility-administered medications for this visit. (Ophthalmic Drugs)   Current Outpatient Medications (Other)  Medication Sig   acetaminophen (TYLENOL) 325 MG tablet Take 325 mg by mouth in the morning and at bedtime.   CALCIUM PO Take by mouth daily. (Patient not taking: Reported on 01/13/2022)   DOXYCYCLINE PO Take 20 g by mouth as directed. Prior to dental procedures (Patient not taking: Reported on 01/13/2022)   gabapentin (NEURONTIN) 100 MG capsule 1 capsule Orally Once a day at bedtime for nerve pain   naproxen sodium (ALEVE) 220 MG tablet Take 220 mg by mouth daily.   NON FORMULARY Vitamin daily (Patient not taking: Reported on 01/13/2022)   rosuvastatin (CRESTOR) 20 MG tablet 1 tablet Orally Once a day at bedtime for cholesterol   No current facility-administered medications for this visit. (Other)    REVIEW OF SYSTEMS: ROS   Positive for: Eyes Negative for: Constitutional, Gastrointestinal, Neurological, Skin, Genitourinary, Musculoskeletal, HENT, Endocrine, Cardiovascular, Respiratory, Psychiatric, Allergic/Imm, Heme/Lymph Last edited by Annie Paras, COT on 04/08/2022  1:08 PM.     ALLERGIES No Known Allergies  PAST MEDICAL HISTORY Past Medical History:  Diagnosis Date   Allergy    Cholecystitis    Colon polyps    Hip bursitis    Lumbar spinal stenosis    Osteopenia    Shingles    Sinusitis    Tobacco abuse    Past Surgical History:  Procedure Laterality Date   CATARACT EXTRACTION     CATARACT EXTRACTION, BILATERAL     CHOLECYSTECTOMY     FAMILY HISTORY Family History  Problem Relation Age of Onset   Macular degeneration Father    Breast cancer Neg Hx    SOCIAL HISTORY Social History   Tobacco Use   Smoking status: Every Day    Packs/day: 0.25    Types: Cigarettes   Smokeless tobacco: Never   Tobacco comments:    2-3 a day/ patients states to many years for how many years did you smoke  Vaping Use   Vaping Use: Never used  Substance Use Topics   Alcohol use: Not Currently   Drug use: Never       OPHTHALMIC EXAM:  Base Eye Exam  Visual Acuity (Snellen - Linear)       Right Left   Dist [place] 20/80 20/25   Dist ph [place] NI          Tonometry (Tonopen, 1:12 PM)       Right Left   Pressure 20 18         Pupils       Dark Light Shape React APD   Right 3 2 Round Slow None   Left 3 2 Round Slow None         Visual Fields       Left Right    Full Full         Extraocular Movement       Right Left    Full, Ortho Full, Ortho         Neuro/Psych     Oriented x3: Yes   Mood/Affect: Normal         Dilation     Both eyes: 1.0% Mydriacyl, 2.5% Phenylephrine @ 1:10 PM           Slit Lamp and Fundus Exam     Slit Lamp Exam       Right Left   Lids/Lashes Dermatochalasis - upper lid, Meibomian gland  dysfunction Dermatochalasis - upper lid, Meibomian gland dysfunction   Conjunctiva/Sclera White and quiet White and quiet   Cornea mild arcus mild arcus   Anterior Chamber Deep and quiet Deep and quiet   Iris Round and moderately dilated Round and moderately dilated   Lens PC IOL in good position PC IOL in good position   Anterior Vitreous Vitreous syneresis, Posterior vitreous detachment, vitreous condensations Vitreous syneresis         Fundus Exam       Right Left   Disc mild Pallor, Sharp rim, temporal PPA/PPP, Compact Pink and Sharp, Compact   C/D Ratio 0.2 0.3   Macula Blunted foveal reflex, Drusen, RPE mottling and clumping, central CNV with edema - slightly increased, shallow SRF -- slightly increased, no frank heme Flat, good foveal reflex, Drusen, RPE mottling, No heme or edema   Vessels attenuated, Tortuous attenuated, Tortuous, mild AV crossing changes, copper wiring   Periphery Attached, reticular degeneration, No heme Attached, reticular degeneration, peripheral drusen, No heme           IMAGING AND PROCEDURES  Imaging and Procedures for 04/08/2022  OCT, Retina - OU - Both Eyes       Right Eye Quality was good. Central Foveal Thickness: 228. Progression has worsened. Findings include no IRF, abnormal foveal contour, retinal drusen , intraretinal hyper-reflective material, choroidal neovascular membrane, pigment epithelial detachment, subretinal fluid (Mild Interval increase in SRF overlying central PED / CNV).   Left Eye Quality was good. Central Foveal Thickness: 265. Progression has been stable. Findings include normal foveal contour, no IRF, no SRF, retinal drusen (Focal patch of ORA superior to disc caught on widefield, trace ERM).   Notes *Images captured and stored on drive  Diagnosis / Impression:  OD: exu ARMD -- Mild Interval increase in SRF overlying central PED / CNV OS: non-exu ARMD -- Focal patch of ORA superior to disc caught on widefield, trace  ERM  Clinical management:  See below  Abbreviations: NFP - Normal foveal profile. CME - cystoid macular edema. PED - pigment epithelial detachment. IRF - intraretinal fluid. SRF - subretinal fluid. EZ - ellipsoid zone. ERM - epiretinal membrane. ORA - outer retinal atrophy. ORT - outer retinal tubulation. SRHM -  subretinal hyper-reflective material. IRHM - intraretinal hyper-reflective material      Intravitreal Injection, Pharmacologic Agent - OD - Right Eye       Time Out 04/08/2022. 1:58 PM. Confirmed correct patient, procedure, site, and patient consented.   Anesthesia Topical anesthesia was used. Anesthetic medications included Lidocaine 2%, Proparacaine 0.5%.   Procedure Preparation included 5% betadine to ocular surface, eyelid speculum. A (32g) needle was used.   Injection: 2 mg aflibercept 2 MG/0.05ML   Route: Intravitreal, Site: Right Eye   NDC: [id number], Lot: [id number], Expiration date: 01/19/2023, Waste: 0 mL   Post-op Post injection exam found visual acuity of at least counting fingers. The patient tolerated the procedure well. There were no complications. The patient received written and verbal post procedure care education. Post injection medications were not given.            ASSESSMENT/PLAN:    ICD-10-CM   1. Exudative age-related macular degeneration of right eye with active choroidal neovascularization (HCC)  H35.3211 OCT, Retina - OU - Both Eyes    Intravitreal Injection, Pharmacologic Agent - OD - Right Eye    aflibercept (EYLEA) SOLN 2 mg    2. Intermediate stage nonexudative age-related macular degeneration of left eye  H35.3122     3. Pseudophakia, both eyes  Z96.1      Exudative age related macular degeneration, right eye - pt essentially asymptomatic and was unaware of any vision changes OD -- incidental finding on routine eye exam w/ Dr. Rosana Hoes - s/p IVA OD #1 (04.25.23), #2 (05.23.23), #3 (06.21.23) - BCVA decreased to 20/80 from 20/60   - OCT shows mild interval increase in SRF overlying central PED / CNV at 4 weeks - discussed IVA resistance and potential benefit in switching medication  - recommend IVE OD #1 today, on 07.19.23  - pt wishes to proceed with IVE - RBA of procedure discussed, questions answered - Eylea informed consent obtained and signed for OD (07.19.23) - see procedure note  - IVA informed consent obtained and signed 04.25.23 (OD)  - f/u in 4 wks, DFE, OCT, possible injection  2. Age related macular degeneration, non-exudative, left eye  - The incidence, anatomy, and pathology of dry AMD, risk of progression, and the AREDS and AREDS 2 study including smoking risks discussed with patient.  - Recommend amsler grid monitoring  3. Pseudophakia OU  - s/p CE/IOL OU (Dr. Talbert Forest)  - IOL in good position, doing well  - monitor  Ophthalmic Meds Ordered this visit:  Meds ordered this encounter  Medications   aflibercept (EYLEA) SOLN 2 mg     Return in about 4 weeks (around 05/06/2022) for f/u exu ARMD OD, DFE, OCT.  There are no Patient Instructions on file for this visit.   Explained the diagnoses, plan, and follow up with the patient and they expressed understanding.  Patient expressed understanding of the importance of proper follow up care.   This document serves as a record of services personally performed by Gardiner Sleeper, MD, PhD. It was created on their behalf by Bernarda Caffey, MD, an ophthalmic technician. The creation of this record is the provider's dictation and/or activities during the visit.    Electronically signed by: Bernarda Caffey, MD 01/30/22 4:42 PM   Gardiner Sleeper, M.D., Ph.D. Diseases & Surgery of the Retina and Vitreous [facility]  I have reviewed the above documentation for accuracy and completeness, and I agree with the above. Gardiner Sleeper, M.D.,  Ph.D. 04/08/22 4:46 PM   Abbreviations: M myopia (nearsighted); A astigmatism; H hyperopia  (farsighted); P presbyopia; Mrx spectacle prescription;  CTL contact lenses; OD right eye; OS left eye; OU both eyes  XT exotropia; ET esotropia; PEK punctate epithelial keratitis; PEE punctate epithelial erosions; DES dry eye syndrome; MGD meibomian gland dysfunction; ATs artificial tears; PFAT's preservative free artificial tears; [place] nuclear sclerotic cataract; PSC posterior subcapsular cataract; ERM epi-retinal membrane; PVD posterior vitreous detachment; RD retinal detachment; DM diabetes mellitus; DR diabetic retinopathy; NPDR non-proliferative diabetic retinopathy; PDR proliferative diabetic retinopathy; CSME clinically significant macular edema; DME diabetic macular edema; dbh dot blot hemorrhages; CWS cotton wool spot; POAG primary open angle glaucoma; C/D cup-to-disc ratio; HVF humphrey visual field; GVF goldmann visual field; OCT optical coherence tomography; IOP intraocular pressure; BRVO Branch retinal vein occlusion; CRVO central retinal vein occlusion; CRAO central retinal artery occlusion; BRAO branch retinal artery occlusion; RT retinal tear; SB scleral buckle; PPV pars plana vitrectomy; VH Vitreous hemorrhage; PRP panretinal laser photocoagulation; IVK intravitreal kenalog; VMT vitreomacular traction; MH Macular hole;  NVD neovascularization of the disc; NVE neovascularization elsewhere; AREDS age related eye disease study; ARMD age related macular degeneration; POAG primary open angle glaucoma; EBMD epithelial/anterior basement membrane dystrophy; ACIOL anterior chamber intraocular lens; IOL intraocular lens; PCIOL posterior chamber intraocular lens; Phaco/IOL phacoemulsification with intraocular lens placement; [place] photorefractive keratectomy; LASIK laser assisted in situ keratomileusis; HTN hypertension; DM diabetes mellitus; COPD chronic obstructive pulmonary disease

## 2022-04-08 ENCOUNTER — Ambulatory Visit (INDEPENDENT_AMBULATORY_CARE_PROVIDER_SITE_OTHER): Payer: Medicare Other | Admitting: Ophthalmology

## 2022-04-08 ENCOUNTER — Encounter (INDEPENDENT_AMBULATORY_CARE_PROVIDER_SITE_OTHER): Payer: Self-pay | Admitting: Ophthalmology

## 2022-04-08 DIAGNOSIS — Z961 Presence of intraocular lens: Secondary | ICD-10-CM

## 2022-04-08 DIAGNOSIS — H353211 Exudative age-related macular degeneration, right eye, with active choroidal neovascularization: Secondary | ICD-10-CM | POA: Diagnosis not present

## 2022-04-08 DIAGNOSIS — H353122 Nonexudative age-related macular degeneration, left eye, intermediate dry stage: Secondary | ICD-10-CM

## 2022-04-08 MED ORDER — AFLIBERCEPT 2MG/0.05ML IZ SOLN FOR KALEIDOSCOPE
2.0000 mg | INTRAVITREAL | Status: AC | PRN
  Administered 2022-04-08: 2 mg via INTRAVITREAL

## 2022-04-28 NOTE — Progress Notes (Signed)
[facility] Note  05/06/2022    CHIEF COMPLAINT Patient presents for Retina Follow Up   HISTORY OF PRESENT ILLNESS: Alison Lewis is a 86 y.o. female who presents to the clinic today for:   HPI     Retina Follow Up   Patient presents with  Wet AMD.  In right eye.  Severity is moderate.  Since onset it is stable.  I, the attending physician,  performed the HPI with the patient and updated documentation appropriately.        Comments   Pt here for 4 wk ret f/u for exu ARMD OD. PT states VA the same, no changes.       Last edited by Bernarda Caffey, MD on 05/07/2022 10:28 AM.    Pt states she is moving to [place], [place] in October  Referring physician: Michael Boston, MD [place],  [place] [address]  HISTORICAL INFORMATION:   Selected notes from the [facility] Referred by Dr. Raenette Rover ([facility]) Truman Hayward: 07/09/21 bs 20/50 OD, 20/20 OS  Ocular Hx- IOL OU PMH- none    CURRENT MEDICATIONS: No current outpatient medications on file. (Ophthalmic Drugs)   No current facility-administered medications for this visit. (Ophthalmic Drugs)   Current Outpatient Medications (Other)  Medication Sig   acetaminophen (TYLENOL) 325 MG tablet Take 325 mg by mouth in the morning and at bedtime.   gabapentin (NEURONTIN) 100 MG capsule 1 capsule Orally Once a day at bedtime for nerve pain   naproxen sodium (ALEVE) 220 MG tablet Take 220 mg by mouth daily.   rosuvastatin (CRESTOR) 20 MG tablet 1 tablet Orally Once a day at bedtime for cholesterol   CALCIUM PO Take by mouth daily. (Patient not taking: Reported on 01/13/2022)   DOXYCYCLINE PO Take 20 g by mouth as directed. Prior to dental procedures (Patient not taking: Reported on 01/13/2022)   NON FORMULARY Vitamin daily (Patient not taking: Reported on 01/13/2022)   No current facility-administered medications for this visit. (Other)   REVIEW OF SYSTEMS: ROS   Positive for:  Eyes Negative for: Constitutional, Gastrointestinal, Neurological, Skin, Genitourinary, Musculoskeletal, HENT, Endocrine, Cardiovascular, Respiratory, Psychiatric, Allergic/Imm, Heme/Lymph Last edited by Kingsley Spittle, COT on 05/06/2022  2:28 PM.      ALLERGIES No Known Allergies  PAST MEDICAL HISTORY Past Medical History:  Diagnosis Date   Allergy    Cholecystitis    Colon polyps    Hip bursitis    Lumbar spinal stenosis    Osteopenia    Shingles    Sinusitis    Tobacco abuse    Past Surgical History:  Procedure Laterality Date   CATARACT EXTRACTION     CATARACT EXTRACTION, BILATERAL     CHOLECYSTECTOMY     FAMILY HISTORY Family History  Problem Relation Age of Onset   Macular degeneration Father    Breast cancer Neg Hx    SOCIAL HISTORY Social History   Tobacco Use   Smoking status: Every Day    Packs/day: 0.25    Types: Cigarettes   Smokeless tobacco: Never   Tobacco comments:    2-3 a day/ patients states to many years for how many years did you smoke  Vaping Use   Vaping Use: Never used  Substance Use Topics   Alcohol use: Not Currently   Drug use: Never       OPHTHALMIC EXAM:  Base Eye Exam     Visual Acuity (Snellen -  Linear)       Right Left   Dist [place] 20/70 +2 20/20 -2   Dist ph [place] NI          Tonometry (Tonopen, 2:32 PM)       Right Left   Pressure 19 16         Pupils       Dark Light Shape React APD   Right 3 2 Round Slow None   Left 3 2 Round Slow None         Visual Fields (Counting fingers)       Left Right    Full Full         Extraocular Movement       Right Left    Full, Ortho Full, Ortho         Neuro/Psych     Oriented x3: Yes   Mood/Affect: Normal         Dilation     Both eyes: 1.0% Mydriacyl, 2.5% Phenylephrine @ 2:34 PM           Slit Lamp and Fundus Exam     Slit Lamp Exam       Right Left   Lids/Lashes Dermatochalasis - upper lid, Meibomian gland dysfunction  Dermatochalasis - upper lid, Meibomian gland dysfunction   Conjunctiva/Sclera White and quiet White and quiet   Cornea mild arcus mild arcus   Anterior Chamber Deep and quiet Deep and quiet   Iris Round and moderately dilated Round and moderately dilated   Lens PC IOL in good position PC IOL in good position   Anterior Vitreous Vitreous syneresis, Posterior vitreous detachment, vitreous condensations Vitreous syneresis         Fundus Exam       Right Left   Disc mild Pallor, Sharp rim, temporal PPA/PPP, Compact Pink and Sharp, Compact   C/D Ratio 0.2 0.3   Macula Blunted foveal reflex, Drusen, RPE mottling and clumping, central CNV with edema - improved, shallow SRF -- improved, no frank heme Flat, good foveal reflex, Drusen, RPE mottling, No heme or edema   Vessels attenuated, Tortuous attenuated, Tortuous, mild AV crossing changes, copper wiring   Periphery Attached, reticular degeneration, No heme Attached, reticular degeneration, peripheral drusen, No heme           IMAGING AND PROCEDURES  Imaging and Procedures for 05/06/2022  OCT, Retina - OU - Both Eyes       Right Eye Quality was good. Central Foveal Thickness: 187. Progression has improved. Findings include normal foveal contour, no IRF, retinal drusen , intraretinal hyper-reflective material, choroidal neovascular membrane, pigment epithelial detachment, subretinal fluid (Mild Interval improvement in SRF overlying central PED / CNV).   Left Eye Quality was good. Central Foveal Thickness: 267. Progression has been stable. Findings include normal foveal contour, no IRF, no SRF, retinal drusen (Focal patch of ORA superior to disc caught on widefield, trace ERM).   Notes *Images captured and stored on drive  Diagnosis / Impression:  OD: exu ARMD -- Mild Interval improvement in SRF overlying central PED / CNV OS: non-exu ARMD -- Focal patch of ORA superior to disc caught on widefield, trace ERM  Clinical management:   See below  Abbreviations: NFP - Normal foveal profile. CME - cystoid macular edema. PED - pigment epithelial detachment. IRF - intraretinal fluid. SRF - subretinal fluid. EZ - ellipsoid zone. ERM - epiretinal membrane. ORA - outer retinal atrophy. ORT - outer retinal tubulation. SRHM - subretinal  hyper-reflective material. IRHM - intraretinal hyper-reflective material      Intravitreal Injection, Pharmacologic Agent - OD - Right Eye       Time Out 05/06/2022. 3:50 PM. Confirmed correct patient, procedure, site, and patient consented.   Anesthesia Topical anesthesia was used. Anesthetic medications included Lidocaine 2%, Proparacaine 0.5%.   Procedure Preparation included 5% betadine to ocular surface, eyelid speculum. A (32g) needle was used.   Injection: 2 mg aflibercept 2 MG/0.05ML   Route: Intravitreal, Site: Right Eye   NDC: [id number], Lot: [id number], Expiration date: 01/19/2023, Waste: 0 mL   Post-op Post injection exam found visual acuity of at least counting fingers. The patient tolerated the procedure well. There were no complications. The patient received written and verbal post procedure care education. Post injection medications were not given.            ASSESSMENT/PLAN:    ICD-10-CM   1. Exudative age-related macular degeneration of right eye with active choroidal neovascularization (HCC)  H35.3211 OCT, Retina - OU - Both Eyes    Intravitreal Injection, Pharmacologic Agent - OD - Right Eye    aflibercept (EYLEA) SOLN 2 mg    2. Intermediate stage nonexudative age-related macular degeneration of left eye  H35.3122     3. Pseudophakia, both eyes  Z96.1       Exudative age related macular degeneration, right eye - pt essentially asymptomatic and was unaware of any vision changes OD -- incidental finding on routine eye exam w/ Dr. Rosana Hoes - s/p IVA OD #1 (04.25.23), #2 (05.23.23), #3 (06.21.23) -- IVA resistance - s/p IVE OD #1 (07.19.23) - BCVA improved to  20/70 from 20/80  - OCT shows mild interval improvement in SRF overlying central PED / CNV at 4 weeks -- good response to IVE  - recommend IVE OD #2 today, on 08.16.23  - pt wishes to proceed with IVE - RBA of procedure discussed, questions answered - see procedure note - Eylea informed consent obtained and signed for OD (07.19.23) - IVA informed consent obtained and signed 04.25.23 (OD)  - f/u in 4 wks, DFE, OCT, possible injection  ** pt moving to [facility] in October -- Will refer to [facility] in [place] or Dr. Glendell Docker  2. Age related macular degeneration, non-exudative, left eye  - The incidence, anatomy, and pathology of dry AMD, risk of progression, and the AREDS and AREDS 2 study including smoking risks discussed with patient.  - Recommend amsler grid monitoring  3. Pseudophakia OU  - s/p CE/IOL OU (Dr. Talbert Forest)  - IOL in good position, doing well  - monitor  Ophthalmic Meds Ordered this visit:  Meds ordered this encounter  Medications   aflibercept (EYLEA) SOLN 2 mg     Return in about 4 weeks (around 06/03/2022) for f/u exu ARMD OD, DFE, OCT.  There are no Patient Instructions on file for this visit.   Explained the diagnoses, plan, and follow up with the patient and they expressed understanding.  Patient expressed understanding of the importance of proper follow up care.   This document serves as a record of services personally performed by Gardiner Sleeper, MD, PhD. It was created on their behalf by San Jetty. Owens Shark, OA an ophthalmic technician. The creation of this record is the provider's dictation and/or activities during the visit.    Electronically signed by: San Jetty. Marguerita Merles 08.16.2023 10:28 AM  Gardiner Sleeper, M.D., Ph.D. Diseases & Surgery of the Retina and Vitreous [facility]  I have reviewed the above documentation for accuracy and completeness, and I agree with the above. Gardiner Sleeper, M.D., Ph.D. 05/07/22 10:40  AM   Abbreviations: M myopia (nearsighted); A astigmatism; H hyperopia (farsighted); P presbyopia; Mrx spectacle prescription;  CTL contact lenses; OD right eye; OS left eye; OU both eyes  XT exotropia; ET esotropia; PEK punctate epithelial keratitis; PEE punctate epithelial erosions; DES dry eye syndrome; MGD meibomian gland dysfunction; ATs artificial tears; PFAT's preservative free artificial tears; [place] nuclear sclerotic cataract; PSC posterior subcapsular cataract; ERM epi-retinal membrane; PVD posterior vitreous detachment; RD retinal detachment; DM diabetes mellitus; DR diabetic retinopathy; NPDR non-proliferative diabetic retinopathy; PDR proliferative diabetic retinopathy; CSME clinically significant macular edema; DME diabetic macular edema; dbh dot blot hemorrhages; CWS cotton wool spot; POAG primary open angle glaucoma; C/D cup-to-disc ratio; HVF humphrey visual field; GVF goldmann visual field; OCT optical coherence tomography; IOP intraocular pressure; BRVO Branch retinal vein occlusion; CRVO central retinal vein occlusion; CRAO central retinal artery occlusion; BRAO branch retinal artery occlusion; RT retinal tear; SB scleral buckle; PPV pars plana vitrectomy; VH Vitreous hemorrhage; PRP panretinal laser photocoagulation; IVK intravitreal kenalog; VMT vitreomacular traction; MH Macular hole;  NVD neovascularization of the disc; NVE neovascularization elsewhere; AREDS age related eye disease study; ARMD age related macular degeneration; POAG primary open angle glaucoma; EBMD epithelial/anterior basement membrane dystrophy; ACIOL anterior chamber intraocular lens; IOL intraocular lens; PCIOL posterior chamber intraocular lens; Phaco/IOL phacoemulsification with intraocular lens placement; [place] photorefractive keratectomy; LASIK laser assisted in situ keratomileusis; HTN hypertension; DM diabetes mellitus; COPD chronic obstructive pulmonary disease

## 2022-05-06 ENCOUNTER — Encounter (INDEPENDENT_AMBULATORY_CARE_PROVIDER_SITE_OTHER): Payer: Self-pay | Admitting: Ophthalmology

## 2022-05-06 ENCOUNTER — Ambulatory Visit (INDEPENDENT_AMBULATORY_CARE_PROVIDER_SITE_OTHER): Payer: Medicare Other | Admitting: Ophthalmology

## 2022-05-06 DIAGNOSIS — Z961 Presence of intraocular lens: Secondary | ICD-10-CM

## 2022-05-06 DIAGNOSIS — H353122 Nonexudative age-related macular degeneration, left eye, intermediate dry stage: Secondary | ICD-10-CM

## 2022-05-06 DIAGNOSIS — H353211 Exudative age-related macular degeneration, right eye, with active choroidal neovascularization: Secondary | ICD-10-CM | POA: Diagnosis not present

## 2022-05-07 ENCOUNTER — Encounter (INDEPENDENT_AMBULATORY_CARE_PROVIDER_SITE_OTHER): Payer: Self-pay | Admitting: Ophthalmology

## 2022-05-07 MED ORDER — AFLIBERCEPT 2MG/0.05ML IZ SOLN FOR KALEIDOSCOPE
2.0000 mg | INTRAVITREAL | Status: AC | PRN
  Administered 2022-05-07: 2 mg via INTRAVITREAL

## 2022-05-28 NOTE — Progress Notes (Signed)
[facility] Note  06/03/2022    CHIEF COMPLAINT Patient presents for Retina Follow Up   HISTORY OF PRESENT ILLNESS: Alison Lewis is a 86 y.o. female who presents to the clinic today for:   HPI     Retina Follow Up   Patient presents with  Wet AMD.  In right eye.  This started months ago.  Severity is moderate.  Duration of 4 weeks.  Since onset it is stable.  I, the attending physician,  performed the HPI with the patient and updated documentation appropriately.        Comments   Patient feels that the vision is stable at this time.       Last edited by Bernarda Caffey, MD on 06/03/2022 10:15 PM.    Pt is moving to [facility] the second week of October, she states she has been told she has dry eyes and to use Systane, but she doesn't use it regularly  Referring physician: Michael Boston, MD [place],  [place] [address]  HISTORICAL INFORMATION:   Selected notes from the [facility] Referred by Dr. Raenette Rover ([facility]) Truman Hayward: 07/09/21 bs 20/50 OD, 20/20 OS  Ocular Hx- IOL OU PMH- none    CURRENT MEDICATIONS: No current outpatient medications on file. (Ophthalmic Drugs)   No current facility-administered medications for this visit. (Ophthalmic Drugs)   Current Outpatient Medications (Other)  Medication Sig   acetaminophen (TYLENOL) 325 MG tablet Take 325 mg by mouth in the morning and at bedtime.   CALCIUM PO Take by mouth daily. (Patient not taking: Reported on 01/13/2022)   DOXYCYCLINE PO Take 20 g by mouth as directed. Prior to dental procedures (Patient not taking: Reported on 01/13/2022)   gabapentin (NEURONTIN) 100 MG capsule 1 capsule Orally Once a day at bedtime for nerve pain   naproxen sodium (ALEVE) 220 MG tablet Take 220 mg by mouth daily.   NON FORMULARY Vitamin daily (Patient not taking: Reported on 01/13/2022)   rosuvastatin (CRESTOR) 20 MG tablet 1 tablet Orally Once a day at bedtime for cholesterol   No  current facility-administered medications for this visit. (Other)   REVIEW OF SYSTEMS: ROS   Positive for: Eyes Negative for: Constitutional, Gastrointestinal, Neurological, Skin, Genitourinary, Musculoskeletal, HENT, Endocrine, Cardiovascular, Respiratory, Psychiatric, Allergic/Imm, Heme/Lymph Last edited by Annie Paras, COT on 06/03/2022  2:50 PM.     ALLERGIES No Known Allergies  PAST MEDICAL HISTORY Past Medical History:  Diagnosis Date   Allergy    Cholecystitis    Colon polyps    Hip bursitis    Lumbar spinal stenosis    Osteopenia    Shingles    Sinusitis    Tobacco abuse    Past Surgical History:  Procedure Laterality Date   CATARACT EXTRACTION     CATARACT EXTRACTION, BILATERAL     CHOLECYSTECTOMY     FAMILY HISTORY Family History  Problem Relation Age of Onset   Macular degeneration Father    Breast cancer Neg Hx    SOCIAL HISTORY Social History   Tobacco Use   Smoking status: Every Day    Packs/day: 0.25    Types: Cigarettes   Smokeless tobacco: Never   Tobacco comments:    2-3 a day/ patients states to many years for how many years did you smoke  Vaping Use   Vaping Use: Never used  Substance Use Topics   Alcohol use: Not Currently   Drug  use: Never       OPHTHALMIC EXAM:  Base Eye Exam     Visual Acuity (Snellen - Linear)       Right Left   Dist [place] 20/70 20/20   Dist ph [place] NI          Tonometry (Tonopen, 2:54 PM)       Right Left   Pressure 16 16         Pupils       Dark Light Shape React APD   Right 3 2 Round Slow None   Left 3 2 Round Slow None         Visual Fields       Left Right    Full Full         Extraocular Movement       Right Left    Full, Ortho Full, Ortho         Neuro/Psych     Oriented x3: Yes   Mood/Affect: Normal         Dilation     Both eyes: 1.0% Mydriacyl, 2.5% Phenylephrine @ 2:50 PM           Slit Lamp and Fundus Exam     Slit Lamp Exam       Right  Left   Lids/Lashes Dermatochalasis - upper lid, Meibomian gland dysfunction Dermatochalasis - upper lid, Meibomian gland dysfunction   Conjunctiva/Sclera White and quiet White and quiet   Cornea mild arcus mild arcus   Anterior Chamber Deep and quiet Deep and quiet   Iris Round and moderately dilated Round and moderately dilated   Lens PC IOL in good position PC IOL in good position   Anterior Vitreous Vitreous syneresis, Posterior vitreous detachment, vitreous condensations Vitreous syneresis         Fundus Exam       Right Left   Disc mild Pallor, Sharp rim, temporal PPA/PPP, Compact Pink and Sharp, Compact   C/D Ratio 0.2 0.3   Macula Blunted foveal reflex, Drusen, RPE mottling, clumping and atrophy, central CNV with edema, shallow SRF -- slightly increased, no frank heme Flat, good foveal reflex, Drusen, RPE mottling, No heme or edema   Vessels attenuated, Tortuous attenuated, Tortuous, mild AV crossing changes, copper wiring   Periphery Attached, reticular degeneration, No heme Attached, reticular degeneration, peripheral drusen, No heme           IMAGING AND PROCEDURES  Imaging and Procedures for 06/03/2022  OCT, Retina - OU - Both Eyes       Right Eye Quality was good. Central Foveal Thickness: 205. Progression has worsened. Findings include normal foveal contour, no IRF, retinal drusen , intraretinal hyper-reflective material, choroidal neovascular membrane, pigment epithelial detachment, subretinal fluid (Mild Interval increase in SRF overlying central stable, shallow PED).   Left Eye Quality was good. Central Foveal Thickness: 273. Progression has been stable. Findings include normal foveal contour, no IRF, no SRF, retinal drusen (Focal patch of ORA superior to disc caught on widefield, trace ERM).   Notes *Images captured and stored on drive  Diagnosis / Impression:  OD: exu ARMD -- Mild Interval increase in SRF overlying central stable, shallow PED OS: non-exu  ARMD -- Focal patch of ORA superior to disc caught on widefield, trace ERM  Clinical management:  See below  Abbreviations: NFP - Normal foveal profile. CME - cystoid macular edema. PED - pigment epithelial detachment. IRF - intraretinal fluid. SRF - subretinal fluid. EZ - ellipsoid  zone. ERM - epiretinal membrane. ORA - outer retinal atrophy. ORT - outer retinal tubulation. SRHM - subretinal hyper-reflective material. IRHM - intraretinal hyper-reflective material      Intravitreal Injection, Pharmacologic Agent - OD - Right Eye       Time Out 06/03/2022. 3:28 PM. Confirmed correct patient, procedure, site, and patient consented.   Anesthesia Topical anesthesia was used. Anesthetic medications included Lidocaine 2%, Proparacaine 0.5%.   Procedure Preparation included 5% betadine to ocular surface, eyelid speculum. A (32g) needle was used.   Injection: 2 mg aflibercept 2 MG/0.05ML   Route: Intravitreal, Site: Right Eye   NDC: [id number], Lot: [id number], Expiration date: 07/22/2023, Waste: 0 mL   Post-op Post injection exam found visual acuity of at least counting fingers. The patient tolerated the procedure well. There were no complications. The patient received written and verbal post procedure care education. Post injection medications were not given.            ASSESSMENT/PLAN:    ICD-10-CM   1. Exudative age-related macular degeneration of right eye with active choroidal neovascularization (HCC)  H35.3211 OCT, Retina - OU - Both Eyes    Intravitreal Injection, Pharmacologic Agent - OD - Right Eye    aflibercept (EYLEA) SOLN 2 mg    2. Intermediate stage nonexudative age-related macular degeneration of left eye  H35.3122     3. Pseudophakia, both eyes  Z96.1      Exudative age related macular degeneration, right eye - pt essentially asymptomatic and was unaware of any vision changes OD -- incidental finding on routine eye exam w/ Dr. Rosana Hoes - s/p IVA OD #1  (04.25.23), #2 (05.23.23), #3 (06.21.23) -- IVA resistance - s/p IVE OD #1 (07.19.23), #2 (08.16.23) - BCVA stable at 20/70   - OCT shows mild Interval increase in SRF overlying central stable, shallow PED  - recommend IVE OD #3 today, on 09.13.23  - pt wishes to proceed with IVE - RBA of procedure discussed, questions answered - see procedure note - Eylea informed consent obtained and signed for OD (07.19.23) - IVA informed consent obtained and signed 04.25.23 (OD)  - f/u in 4 wks, DFE, OCT, possible injection  ** pt moving to [facility] in October -- Will refer to [facility] in [place] or Dr. Glendell Docker  2. Age related macular degeneration, non-exudative, left eye  - The incidence, anatomy, and pathology of dry AMD, risk of progression, and the AREDS and AREDS 2 study including smoking risks discussed with patient.  - Recommend amsler grid monitoring  3. Pseudophakia OU  - s/p CE/IOL OU (Dr. Talbert Forest)  - IOL in good position, doing well  - monitor  Ophthalmic Meds Ordered this visit:  Meds ordered this encounter  Medications   aflibercept (EYLEA) SOLN 2 mg     Return in about 4 weeks (around 07/01/2022) for f/u exu ARMD OD, DFE, OCT.  There are no Patient Instructions on file for this visit.   Explained the diagnoses, plan, and follow up with the patient and they expressed understanding.  Patient expressed understanding of the importance of proper follow up care.   This document serves as a record of services personally performed by Gardiner Sleeper, MD, PhD. It was created on their behalf by Orvan Falconer, an ophthalmic technician. The creation of this record is the provider's dictation and/or activities during the visit.    Electronically signed by: Orvan Falconer, OA, 06/07/22  1:39 AM  This document serves as a record of  services personally performed by Gardiner Sleeper, MD, PhD. It was created on their behalf by San Jetty. Owens Shark, OA an ophthalmic technician. The creation  of this record is the provider's dictation and/or activities during the visit.    Electronically signed by: San Jetty. Owens Shark, [place] 09.13.2023 1:39 AM  Gardiner Sleeper, M.D., Ph.D. Diseases & Surgery of the Retina and Vitreous [facility]  I have reviewed the above documentation for accuracy and completeness, and I agree with the above. Gardiner Sleeper, M.D., Ph.D. 06/07/22 1:39 AM   Abbreviations: M myopia (nearsighted); A astigmatism; H hyperopia (farsighted); P presbyopia; Mrx spectacle prescription;  CTL contact lenses; OD right eye; OS left eye; OU both eyes  XT exotropia; ET esotropia; PEK punctate epithelial keratitis; PEE punctate epithelial erosions; DES dry eye syndrome; MGD meibomian gland dysfunction; ATs artificial tears; PFAT's preservative free artificial tears; [place] nuclear sclerotic cataract; PSC posterior subcapsular cataract; ERM epi-retinal membrane; PVD posterior vitreous detachment; RD retinal detachment; DM diabetes mellitus; DR diabetic retinopathy; NPDR non-proliferative diabetic retinopathy; PDR proliferative diabetic retinopathy; CSME clinically significant macular edema; DME diabetic macular edema; dbh dot blot hemorrhages; CWS cotton wool spot; POAG primary open angle glaucoma; C/D cup-to-disc ratio; HVF humphrey visual field; GVF goldmann visual field; OCT optical coherence tomography; IOP intraocular pressure; BRVO Branch retinal vein occlusion; CRVO central retinal vein occlusion; CRAO central retinal artery occlusion; BRAO branch retinal artery occlusion; RT retinal tear; SB scleral buckle; PPV pars plana vitrectomy; VH Vitreous hemorrhage; PRP panretinal laser photocoagulation; IVK intravitreal kenalog; VMT vitreomacular traction; MH Macular hole;  NVD neovascularization of the disc; NVE neovascularization elsewhere; AREDS age related eye disease study; ARMD age related macular degeneration; POAG primary open angle glaucoma; EBMD epithelial/anterior  basement membrane dystrophy; ACIOL anterior chamber intraocular lens; IOL intraocular lens; PCIOL posterior chamber intraocular lens; Phaco/IOL phacoemulsification with intraocular lens placement; [place] photorefractive keratectomy; LASIK laser assisted in situ keratomileusis; HTN hypertension; DM diabetes mellitus; COPD chronic obstructive pulmonary disease

## 2022-06-03 ENCOUNTER — Encounter (INDEPENDENT_AMBULATORY_CARE_PROVIDER_SITE_OTHER): Payer: Self-pay | Admitting: Ophthalmology

## 2022-06-03 ENCOUNTER — Ambulatory Visit (INDEPENDENT_AMBULATORY_CARE_PROVIDER_SITE_OTHER): Payer: Medicare Other | Admitting: Ophthalmology

## 2022-06-03 DIAGNOSIS — H353122 Nonexudative age-related macular degeneration, left eye, intermediate dry stage: Secondary | ICD-10-CM

## 2022-06-03 DIAGNOSIS — Z961 Presence of intraocular lens: Secondary | ICD-10-CM

## 2022-06-03 DIAGNOSIS — H353211 Exudative age-related macular degeneration, right eye, with active choroidal neovascularization: Secondary | ICD-10-CM | POA: Diagnosis not present

## 2022-06-03 MED ORDER — AFLIBERCEPT 2MG/0.05ML IZ SOLN FOR KALEIDOSCOPE
2.0000 mg | INTRAVITREAL | Status: AC | PRN
  Administered 2022-06-03: 2 mg via INTRAVITREAL

## 2022-06-30 NOTE — Progress Notes (Signed)
[facility] Note  07/01/2022    CHIEF COMPLAINT Patient presents for Retina Follow Up   HISTORY OF PRESENT ILLNESS: Alison Lewis is a 86 y.o. female who presents to the clinic today for:   HPI     Retina Follow Up   Patient presents with  Wet AMD.  In right eye.  Severity is moderate.  Duration of 4 weeks.  Since onset it is stable.  I, the attending physician,  performed the HPI with the patient and updated documentation appropriately.        Comments   Patient states vision the same OU.      Last edited by Bernarda Caffey, MD on 07/01/2022  7:49 PM.     Referring physician: Michael Boston, MD [place],  [place] [address]  HISTORICAL INFORMATION:   Selected notes from the MEDICAL RECORD NUMBER Referred by Dr. Raenette Rover ([facility]) Truman Hayward: 07/09/21 bs 20/50 OD, 20/20 OS  Ocular Hx- IOL OU PMH- none    CURRENT MEDICATIONS: No current outpatient medications on file. (Ophthalmic Drugs)   No current facility-administered medications for this visit. (Ophthalmic Drugs)   Current Outpatient Medications (Other)  Medication Sig   acetaminophen (TYLENOL) 325 MG tablet Take 325 mg by mouth in the morning and at bedtime.   gabapentin (NEURONTIN) 100 MG capsule 1 capsule Orally Once a day at bedtime for nerve pain   naproxen sodium (ALEVE) 220 MG tablet Take 220 mg by mouth daily.   rosuvastatin (CRESTOR) 20 MG tablet 1 tablet Orally Once a day at bedtime for cholesterol   CALCIUM PO Take by mouth daily. (Patient not taking: Reported on 01/13/2022)   DOXYCYCLINE PO Take 20 g by mouth as directed. Prior to dental procedures (Patient not taking: Reported on 01/13/2022)   NON FORMULARY Vitamin daily (Patient not taking: Reported on 01/13/2022)   No current facility-administered medications for this visit. (Other)   REVIEW OF SYSTEMS: ROS   Positive for: Eyes Negative for: Constitutional, Gastrointestinal, Neurological, Skin,  Genitourinary, Musculoskeletal, HENT, Endocrine, Cardiovascular, Respiratory, Psychiatric, Allergic/Imm, Heme/Lymph Last edited by Roselee Nova D, COT on 07/01/2022  2:48 PM.     ALLERGIES No Known Allergies  PAST MEDICAL HISTORY Past Medical History:  Diagnosis Date   Allergy    Cholecystitis    Colon polyps    Hip bursitis    Lumbar spinal stenosis    Osteopenia    Shingles    Sinusitis    Tobacco abuse    Past Surgical History:  Procedure Laterality Date   CATARACT EXTRACTION     CATARACT EXTRACTION, BILATERAL     CHOLECYSTECTOMY     FAMILY HISTORY Family History  Problem Relation Age of Onset   Macular degeneration Father    Breast cancer Neg Hx    SOCIAL HISTORY Social History   Tobacco Use   Smoking status: Every Day    Packs/day: 0.25    Types: Cigarettes   Smokeless tobacco: Never   Tobacco comments:    2-3 a day/ patients states to many years for how many years did you smoke  Vaping Use   Vaping Use: Never used  Substance Use Topics   Alcohol use: Not Currently   Drug use: Never       OPHTHALMIC EXAM:  Base Eye Exam     Visual Acuity (Snellen - Linear)       Right Left   Dist [place] 20/80 -1 20/20 -1  Dist ph [place] 20/50 -1          Tonometry (Tonopen, 2:57 PM)       Right Left   Pressure 20 14         Pupils       Dark Light Shape React APD   Right 3 2 Round Slow None   Left 3 2 Round Slow None         Visual Fields (Counting fingers)       Left Right    Full Full         Extraocular Movement       Right Left    Full, Ortho Full, Ortho         Neuro/Psych     Oriented x3: Yes   Mood/Affect: Normal         Dilation     Both eyes: 1.0% Mydriacyl, 2.5% Phenylephrine @ 2:57 PM           Slit Lamp and Fundus Exam     Slit Lamp Exam       Right Left   Lids/Lashes Dermatochalasis - upper lid, Meibomian gland dysfunction Dermatochalasis - upper lid, Meibomian gland dysfunction   Conjunctiva/Sclera  White and quiet White and quiet   Cornea mild arcus mild arcus   Anterior Chamber Deep and quiet Deep and quiet   Iris Round and moderately dilated Round and moderately dilated   Lens PC IOL in good position PC IOL in good position   Anterior Vitreous Vitreous syneresis, Posterior vitreous detachment, vitreous condensations Vitreous syneresis         Fundus Exam       Right Left   Disc mild Pallor, Sharp rim, temporal PPA/PPP, Compact Pink and Sharp, Compact   C/D Ratio 0.2 0.3   Macula Blunted foveal reflex, Drusen, RPE mottling, clumping and atrophy, central CNV with edema, shallow SRF -- improved, no frank heme Flat, good foveal reflex, Drusen, RPE mottling, No heme or edema   Vessels attenuated, Tortuous attenuated, Tortuous, mild AV crossing changes, copper wiring   Periphery Attached, reticular degeneration, No heme Attached, reticular degeneration, peripheral drusen, No heme           IMAGING AND PROCEDURES  Imaging and Procedures for 07/01/2022  OCT, Retina - OU - Both Eyes       Right Eye Quality was good. Central Foveal Thickness: 195. Progression has improved. Findings include normal foveal contour, no IRF, retinal drusen , intraretinal hyper-reflective material, choroidal neovascular membrane, pigment epithelial detachment, subretinal fluid (Interval improvement in SRF and low-lying PED).   Left Eye Quality was good. Central Foveal Thickness: 274. Progression has been stable. Findings include normal foveal contour, no IRF, no SRF, retinal drusen (Focal patch of ORA superior to disc caught on widefield, trace ERM).   Notes *Images captured and stored on drive  Diagnosis / Impression:  OD: exu ARMD -- Interval improvement in SRF and low-lying PED OS: non-exu ARMD -- Focal patch of ORA superior to disc caught on widefield, trace ERM  Clinical management:  See below  Abbreviations: NFP - Normal foveal profile. CME - cystoid macular edema. PED - pigment epithelial  detachment. IRF - intraretinal fluid. SRF - subretinal fluid. EZ - ellipsoid zone. ERM - epiretinal membrane. ORA - outer retinal atrophy. ORT - outer retinal tubulation. SRHM - subretinal hyper-reflective material. IRHM - intraretinal hyper-reflective material      Intravitreal Injection, Pharmacologic Agent - OD - Right Eye  Time Out 07/01/2022. 4:02 PM. Confirmed correct patient, procedure, site, and patient consented.   Anesthesia Topical anesthesia was used. Anesthetic medications included Lidocaine 2%, Proparacaine 0.5%.   Procedure Preparation included 5% betadine to ocular surface, eyelid speculum. A (32g) needle was used.   Injection: 2 mg aflibercept 2 MG/0.05ML   Route: Intravitreal, Site: Right Eye   NDC: [id number], Lot: [id number], Expiration date: 07/22/2023, Waste: 0 mL   Post-op Post injection exam found visual acuity of at least counting fingers. The patient tolerated the procedure well. There were no complications. The patient received written and verbal post procedure care education. Post injection medications were not given.            ASSESSMENT/PLAN:    ICD-10-CM   1. Exudative age-related macular degeneration of right eye with active choroidal neovascularization (HCC)  H35.3211 OCT, Retina - OU - Both Eyes    Intravitreal Injection, Pharmacologic Agent - OD - Right Eye    aflibercept (EYLEA) SOLN 2 mg    2. Intermediate stage nonexudative age-related macular degeneration of left eye  H35.3122     3. Pseudophakia, both eyes  Z96.1      Exudative age related macular degeneration, right eye - pt essentially asymptomatic and was unaware of any vision changes OD -- incidental finding on routine eye exam w/ Dr. Rosana Hoes - s/p IVA OD #1 (04.25.23), #2 (05.23.23), #3 (06.21.23) -- IVA resistance - s/p IVE OD #1 (07.19.23), #2 (08.16.23), #3 (09.13.23) - BCVA improved from 20/70 to 20/50-1 - OCT shows interval improvement in SRF and low-lying PED  -  recommend IVE OD #4 today, on 10.11.23  - pt wishes to proceed with IVE - RBA of procedure discussed, questions answered - see procedure note - Eylea informed consent obtained and signed for OD (07.19.23) - IVA informed consent obtained and signed 04.25.23 (OD)  - f/u in 4 wks, DFE, OCT, possible injection  ** pt moving to [facility] in October -- Will refer to [facility] in [place] **  - will be due for next injection around 11.08.23  2. Age related macular degeneration, non-exudative, left eye  - The incidence, anatomy, and pathology of dry AMD, risk of progression, and the AREDS and AREDS 2 study including smoking risks discussed with patient.  - Recommend amsler grid monitoring  3. Pseudophakia OU  - s/p CE/IOL OU (Dr. Talbert Forest)  - IOL in good position, doing well  - monitor  Ophthalmic Meds Ordered this visit:  Meds ordered this encounter  Medications   aflibercept (EYLEA) SOLN 2 mg     Return if symptoms worsen or fail to improve.  There are no Patient Instructions on file for this visit.   Explained the diagnoses, plan, and follow up with the patient and they expressed understanding.  Patient expressed understanding of the importance of proper follow up care.   This document serves as a record of services personally performed by Gardiner Sleeper, MD, PhD. It was created on their behalf by Roselee Nova, COMT. The creation of this record is the provider's dictation and/or activities during the visit.  Electronically signed by: Roselee Nova, COMT 07/01/22 7:51 PM  Gardiner Sleeper, M.D., Ph.D. Diseases & Surgery of the Retina and Vitreous [facility] [place]  I have reviewed the above documentation for accuracy and completeness, and I agree with the above. Gardiner Sleeper, M.D., Ph.D. 07/01/22 7:54 PM   Abbreviations: M myopia (nearsighted); A astigmatism; H hyperopia (farsighted); P  presbyopia; Mrx spectacle prescription;  CTL contact lenses; OD right  eye; OS left eye; OU both eyes  XT exotropia; ET esotropia; PEK punctate epithelial keratitis; PEE punctate epithelial erosions; DES dry eye syndrome; MGD meibomian gland dysfunction; ATs artificial tears; PFAT's preservative free artificial tears; [place] nuclear sclerotic cataract; PSC posterior subcapsular cataract; ERM epi-retinal membrane; PVD posterior vitreous detachment; RD retinal detachment; DM diabetes mellitus; DR diabetic retinopathy; NPDR non-proliferative diabetic retinopathy; PDR proliferative diabetic retinopathy; CSME clinically significant macular edema; DME diabetic macular edema; dbh dot blot hemorrhages; CWS cotton wool spot; POAG primary open angle glaucoma; C/D cup-to-disc ratio; HVF humphrey visual field; GVF goldmann visual field; OCT optical coherence tomography; IOP intraocular pressure; BRVO Branch retinal vein occlusion; CRVO central retinal vein occlusion; CRAO central retinal artery occlusion; BRAO branch retinal artery occlusion; RT retinal tear; SB scleral buckle; PPV pars plana vitrectomy; VH Vitreous hemorrhage; PRP panretinal laser photocoagulation; IVK intravitreal kenalog; VMT vitreomacular traction; MH Macular hole;  NVD neovascularization of the disc; NVE neovascularization elsewhere; AREDS age related eye disease study; ARMD age related macular degeneration; POAG primary open angle glaucoma; EBMD epithelial/anterior basement membrane dystrophy; ACIOL anterior chamber intraocular lens; IOL intraocular lens; PCIOL posterior chamber intraocular lens; Phaco/IOL phacoemulsification with intraocular lens placement; [place] photorefractive keratectomy; LASIK laser assisted in situ keratomileusis; HTN hypertension; DM diabetes mellitus; COPD chronic obstructive pulmonary disease

## 2022-07-01 ENCOUNTER — Ambulatory Visit (INDEPENDENT_AMBULATORY_CARE_PROVIDER_SITE_OTHER): Payer: Medicare Other | Admitting: Ophthalmology

## 2022-07-01 ENCOUNTER — Encounter (INDEPENDENT_AMBULATORY_CARE_PROVIDER_SITE_OTHER): Payer: Self-pay | Admitting: Ophthalmology

## 2022-07-01 DIAGNOSIS — H353211 Exudative age-related macular degeneration, right eye, with active choroidal neovascularization: Secondary | ICD-10-CM

## 2022-07-01 DIAGNOSIS — Z961 Presence of intraocular lens: Secondary | ICD-10-CM

## 2022-07-01 DIAGNOSIS — H353122 Nonexudative age-related macular degeneration, left eye, intermediate dry stage: Secondary | ICD-10-CM

## 2022-07-01 MED ORDER — AFLIBERCEPT 2MG/0.05ML IZ SOLN FOR KALEIDOSCOPE
2.0000 mg | INTRAVITREAL | Status: AC | PRN
  Administered 2022-07-01: 2 mg via INTRAVITREAL

## 2023-09-29 IMAGING — MG MM DIGITAL SCREENING BILAT W/ TOMO AND CAD
8 series · 8 of 24 positions shown · non-contrast
Comparison: Previous exam(s).

CLINICAL DATA: Screening.

EXAM:
DIGITAL SCREENING BILATERAL MAMMOGRAM WITH TOMOSYNTHESIS AND CAD
TECHNIQUE: Bilateral screening digital craniocaudal and mediolateral oblique
mammograms were obtained. Bilateral screening digital breast
tomosynthesis was performed. The images were evaluated with
computer-aided detection.

[L MLO synth-2D]
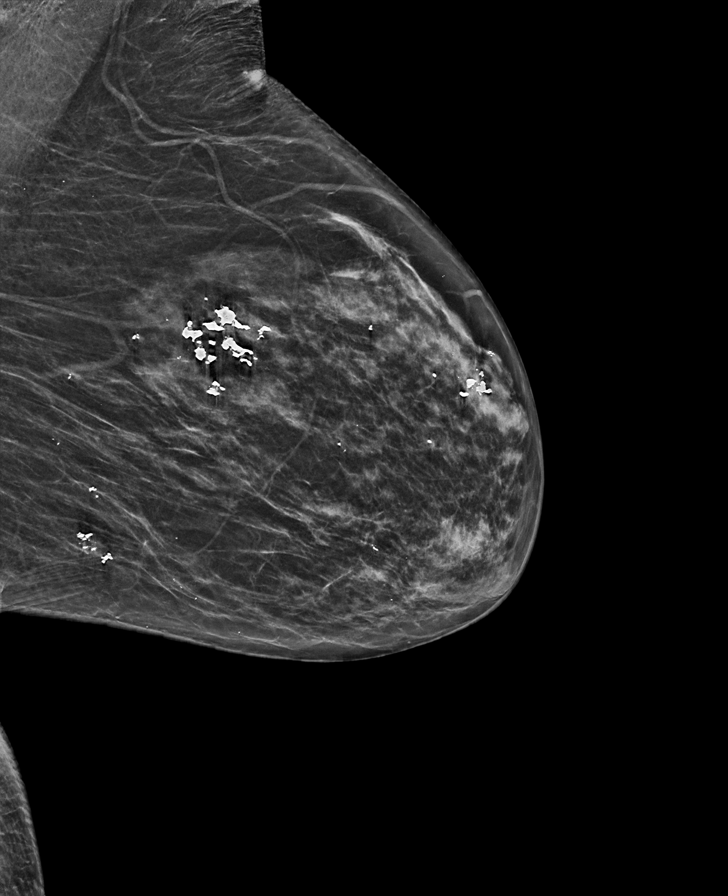

[R CC synth-2D]
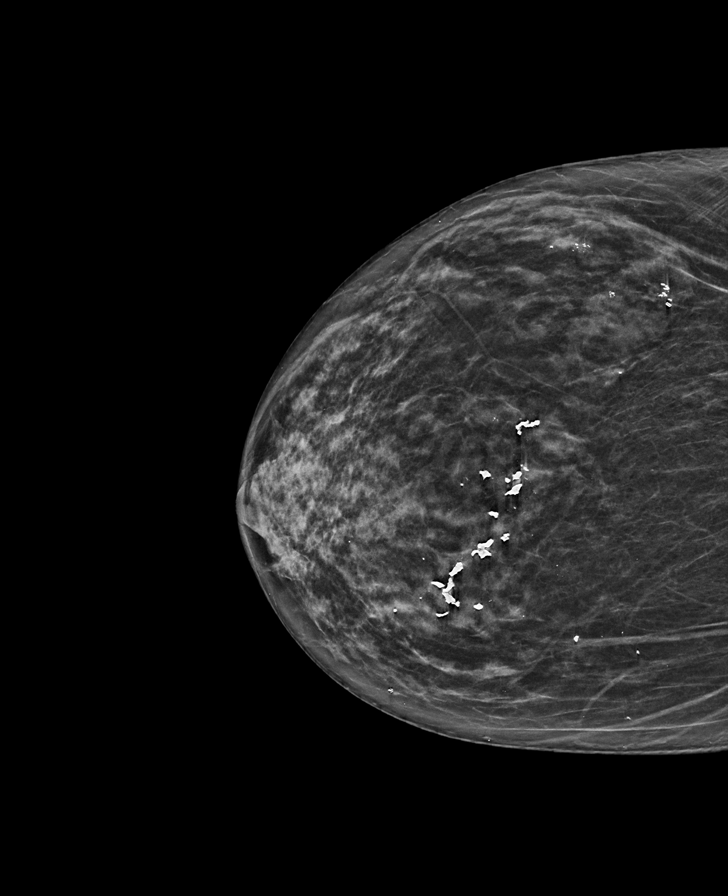

[L CC synth-2D]
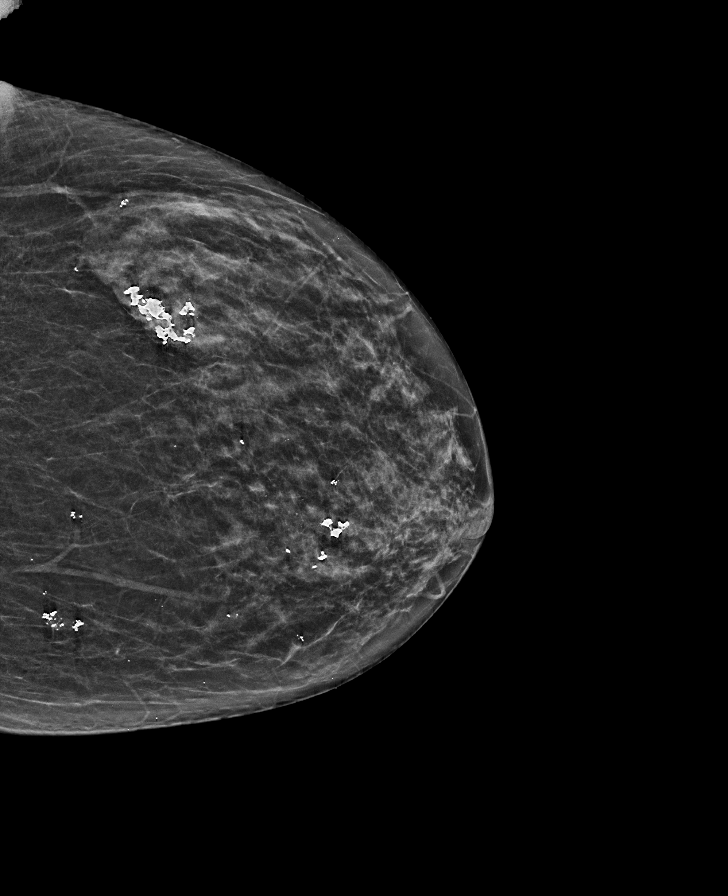

[R MLO synth-2D]
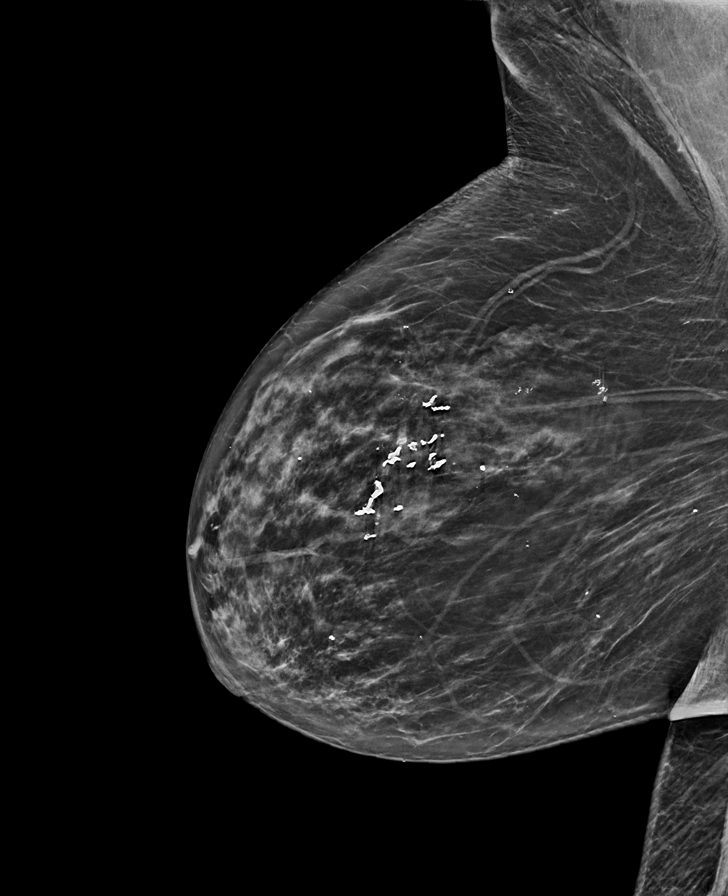

[L CC tomo · tomo slice 27/52.0]
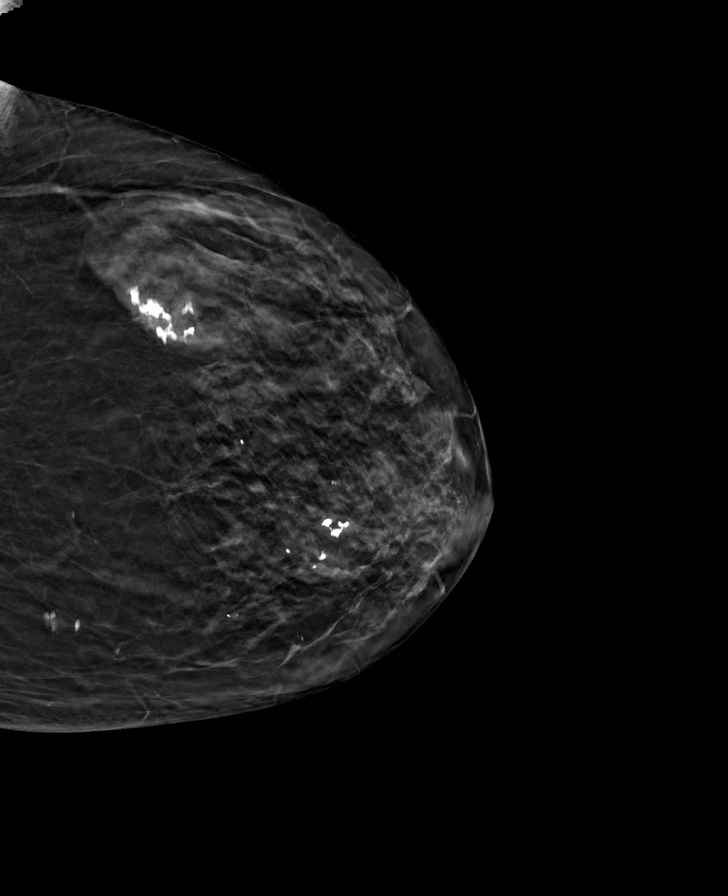

[R CC tomo · tomo slice 29/56.0]
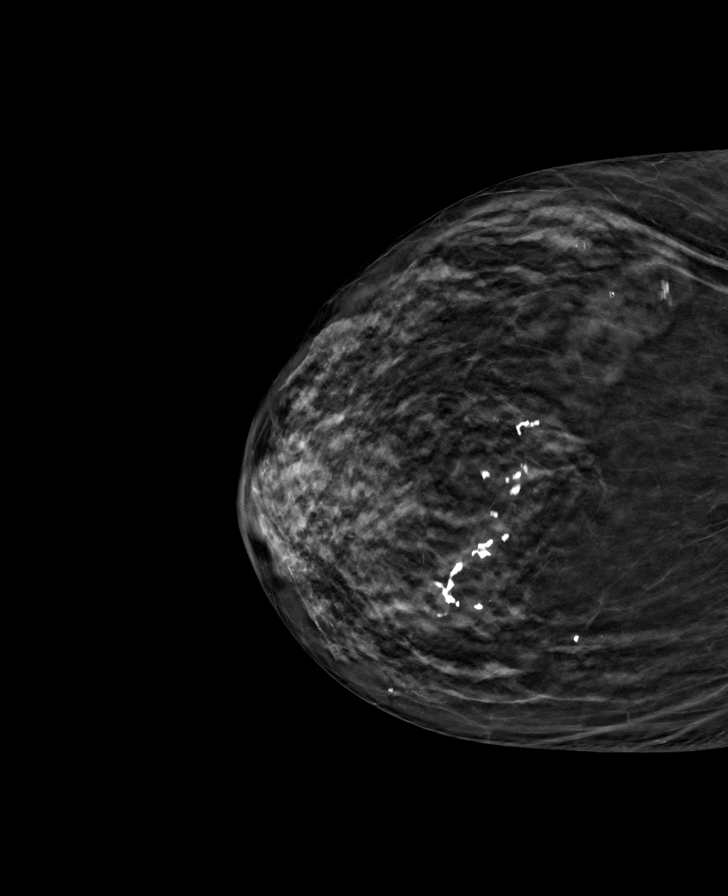

[L MLO tomo · tomo slice 29/58.0]
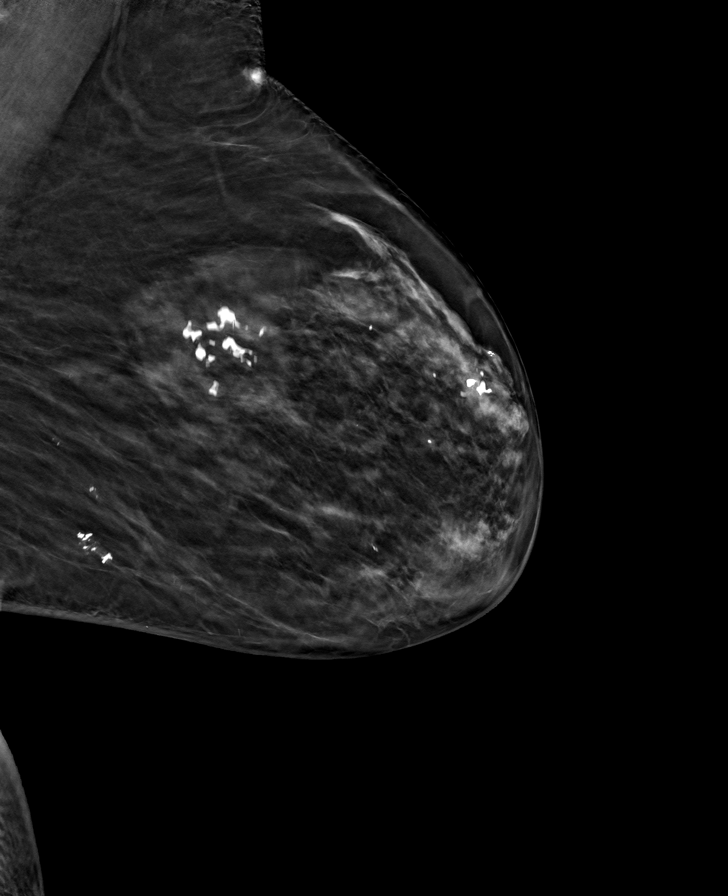

[R MLO tomo · tomo slice 33/65.0]
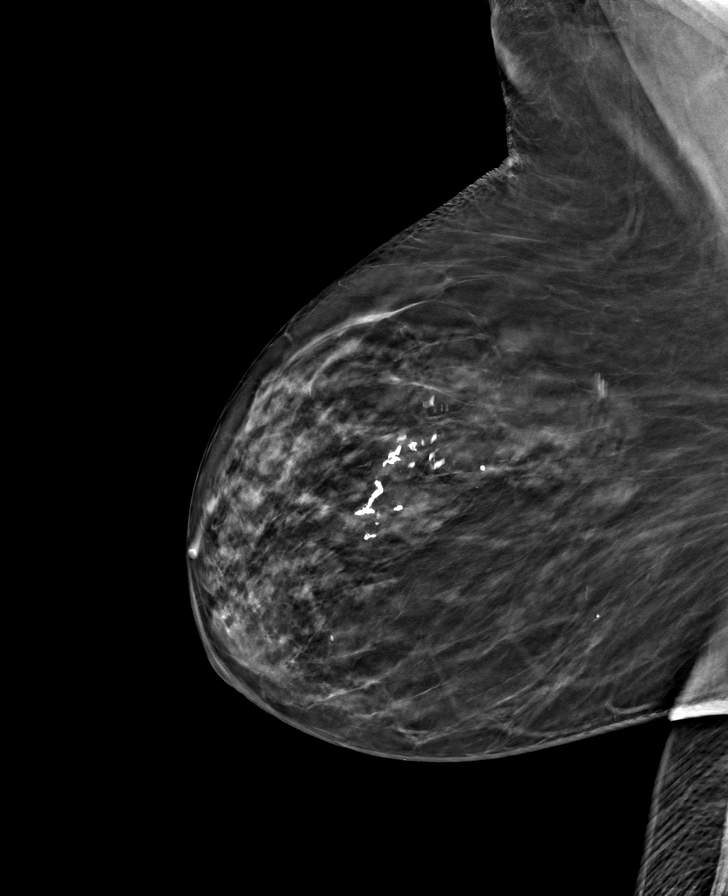

[8 of 24 positions shown; findings below may reference images not displayed]

ACR Breast Density Category c: The breast tissue is heterogeneously
dense, which may obscure small masses.
FINDINGS: There are no findings suspicious for malignancy.
IMPRESSION: No mammographic evidence of malignancy. A result letter of this
screening mammogram will be mailed directly to the patient.

RECOMMENDATION:
Screening mammogram in one year. (Code:Q3-W-BC3)

BI-RADS CATEGORY  1: Negative.
